# Patient Record
Sex: Male | Born: 1995 | Race: White | Hispanic: No | Marital: Single | Smoking: Current some day smoker
Health system: Southern US, Community
[De-identification: ages and names within clinical notes are randomized; demographics above are authoritative.]

## PROBLEM LIST (undated history)

## (undated) DIAGNOSIS — E669 Obesity, unspecified: Secondary | ICD-10-CM

## (undated) DIAGNOSIS — F319 Bipolar disorder, unspecified: Secondary | ICD-10-CM

## (undated) DIAGNOSIS — I10 Essential (primary) hypertension: Secondary | ICD-10-CM

## (undated) DIAGNOSIS — M109 Gout, unspecified: Secondary | ICD-10-CM

## (undated) DIAGNOSIS — F909 Attention-deficit hyperactivity disorder, unspecified type: Secondary | ICD-10-CM

## (undated) DIAGNOSIS — F329 Major depressive disorder, single episode, unspecified: Secondary | ICD-10-CM

## (undated) DIAGNOSIS — F32A Depression, unspecified: Secondary | ICD-10-CM

## (undated) DIAGNOSIS — E785 Hyperlipidemia, unspecified: Secondary | ICD-10-CM

## (undated) HISTORY — DX: Obesity, unspecified: E66.9

## (undated) HISTORY — DX: Major depressive disorder, single episode, unspecified: F32.9

## (undated) HISTORY — PX: WISDOM TOOTH EXTRACTION: SHX21

## (undated) HISTORY — DX: Hyperlipidemia, unspecified: E78.5

## (undated) HISTORY — DX: Depression, unspecified: F32.A

## (undated) HISTORY — DX: Attention-deficit hyperactivity disorder, unspecified type: F90.9

## (undated) HISTORY — DX: Bipolar disorder, unspecified: F31.9

---
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
## 2014-09-08 ENCOUNTER — Emergency Department: Payer: Self-pay | Admitting: Emergency Medicine

## 2015-06-18 DIAGNOSIS — Z8659 Personal history of other mental and behavioral disorders: Secondary | ICD-10-CM | POA: Insufficient documentation

## 2015-06-18 DIAGNOSIS — R51 Headache: Secondary | ICD-10-CM

## 2015-06-18 DIAGNOSIS — F329 Major depressive disorder, single episode, unspecified: Secondary | ICD-10-CM | POA: Insufficient documentation

## 2015-06-18 DIAGNOSIS — E669 Obesity, unspecified: Secondary | ICD-10-CM | POA: Insufficient documentation

## 2015-06-18 DIAGNOSIS — E782 Mixed hyperlipidemia: Secondary | ICD-10-CM | POA: Insufficient documentation

## 2015-06-18 DIAGNOSIS — F988 Other specified behavioral and emotional disorders with onset usually occurring in childhood and adolescence: Secondary | ICD-10-CM | POA: Insufficient documentation

## 2015-06-18 DIAGNOSIS — F32A Depression, unspecified: Secondary | ICD-10-CM | POA: Insufficient documentation

## 2015-06-18 DIAGNOSIS — R519 Headache, unspecified: Secondary | ICD-10-CM | POA: Insufficient documentation

## 2015-06-23 ENCOUNTER — Encounter: Payer: Self-pay | Admitting: Family Medicine

## 2015-06-23 ENCOUNTER — Ambulatory Visit (INDEPENDENT_AMBULATORY_CARE_PROVIDER_SITE_OTHER): Payer: Self-pay | Admitting: Family Medicine

## 2015-06-23 VITALS — BP 116/80 | HR 79 | Temp 98.3°F | Resp 16 | Ht 73.0 in | Wt 364.8 lb

## 2015-06-23 DIAGNOSIS — R079 Chest pain, unspecified: Secondary | ICD-10-CM

## 2015-06-23 DIAGNOSIS — Z23 Encounter for immunization: Secondary | ICD-10-CM

## 2015-06-23 NOTE — Patient Instructions (Addendum)
Keep Rolaids and take one next time you get chest tightness and see if it helps. Come in once a week for nurse bp checks for a couple of weeks.

## 2015-06-23 NOTE — Progress Notes (Signed)
Subjective:     Patient ID: Dean Tucker, male   DOB: 01/21/1996, 19 y.o.   MRN: [id number]  HPI  Chief Complaint  Patient presents with  . Annual Exam    Patient comes in office today for his annual exam he is accompanied by his mother. Patients mother has concerns of elevated blood pressure for the past 3-4 months, mother states that patient complains of frequent headaches. Patient would like to address chest pain that has been intermittent described as a dull ache on the right side of his chest , last episode was yesterday and lasted only a matter of minutes. Patient reported that when he had pain yesterdayt he laid down in bed.  Due to hx of intermittent chest tightness and elevated bp will focus on these issues rather than wellness visit today. + smoker but no family history of coronary artery disease. Elevated blood pressures were obtained with a home electronic cuff-? Cuff size too small.   Review of Systems  Respiratory: Negative for shortness of breath.   Cardiovascular: Negative for palpitations.  Gastrointestinal:       Reports rare heartburn:"when I eat spaghetti".       Objective:   Physical Exam  Constitutional: He appears well-developed and well-nourished. No distress.  Cardiovascular: Normal rate and regular rhythm.   Pulmonary/Chest: Breath sounds normal. He exhibits no tenderness.  Abdominal: Soft. There is no tenderness.       Assessment:    1. Chest pain, unspecified chest pain type - EKG 12-Lead - Lipid panel - Comprehensive metabolic panel  2. Need for influenza vaccination - Flu Vaccine QUAD 36+ mos IM    Plan:    Further f/u pending lab results.

## 2015-06-24 ENCOUNTER — Telehealth: Payer: Self-pay

## 2015-06-24 LAB — LIPID PANEL
CHOLESTEROL TOTAL: 235 mg/dL — AB (ref 100–169)
Chol/HDL Ratio: 7.6 ratio units — ABNORMAL HIGH (ref 0.0–5.0)
HDL: 31 mg/dL — ABNORMAL LOW (ref >39)
LDL CALC: 151 mg/dL — AB (ref 0–109)
Triglycerides: 267 mg/dL — ABNORMAL HIGH (ref 0–89)
VLDL Cholesterol Cal: 53 mg/dL — ABNORMAL HIGH (ref 5–40)

## 2015-06-24 LAB — COMPREHENSIVE METABOLIC PANEL
ALT: 39 IU/L (ref 0–44)
AST: 21 IU/L (ref 0–40)
Albumin/Globulin Ratio: 1.9 (ref 1.1–2.5)
Albumin: 4.7 g/dL (ref 3.5–5.5)
Alkaline Phosphatase: 67 IU/L (ref 39–117)
BUN/Creatinine Ratio: 11 (ref 8–19)
BUN: 9 mg/dL (ref 6–20)
Bilirubin Total: 0.5 mg/dL (ref 0.0–1.2)
CALCIUM: 10.2 mg/dL (ref 8.7–10.2)
CO2: 23 mmol/L (ref 18–29)
CREATININE: 0.82 mg/dL (ref 0.76–1.27)
Chloride: 102 mmol/L (ref 97–106)
GFR calc Af Amer: 148 mL/min/{1.73_m2} (ref >59)
GFR, EST NON AFRICAN AMERICAN: 128 mL/min/{1.73_m2} (ref >59)
GLOBULIN, TOTAL: 2.5 g/dL (ref 1.5–4.5)
GLUCOSE: 77 mg/dL (ref 65–99)
Potassium: 4.6 mmol/L (ref 3.5–5.2)
SODIUM: 143 mmol/L (ref 136–144)
Total Protein: 7.2 g/dL (ref 6.0–8.5)

## 2015-06-24 NOTE — Telephone Encounter (Signed)
-----   Message from Anola Gurneyobert Chauvin, [place] sent at 06/24/2015  7:57 AM EST ----- Cholesterol is moderately elevated but other labs including sugar are ok. I have provided a handout (at front desk) regarding elevated cholesterol and food choices. Would also refer to a nutritionist if you wish and your insurance covers.

## 2015-06-24 NOTE — Telephone Encounter (Signed)
Advised patient of lab report, at this time he has declined information handout and referral to nutritionist. I advised patient to watch for foods that are high in saturated fat and cholesterol and to try to become active at least 30 min a day. KW

## 2015-07-25 ENCOUNTER — Emergency Department (HOSPITAL_COMMUNITY): Admission: EM | Admit: 2015-07-25 | Discharge: 2015-07-25 | Discharge status: Absent without leave | Payer: Self-pay

## 2016-01-02 ENCOUNTER — Encounter: Payer: Self-pay | Admitting: Family Medicine

## 2016-01-02 ENCOUNTER — Ambulatory Visit (INDEPENDENT_AMBULATORY_CARE_PROVIDER_SITE_OTHER): Payer: Self-pay | Admitting: Family Medicine

## 2016-01-02 VITALS — BP 130/80 | HR 90 | Temp 97.8°F | Resp 16 | Wt 342.8 lb

## 2016-01-02 DIAGNOSIS — L0591 Pilonidal cyst without abscess: Secondary | ICD-10-CM

## 2016-01-02 NOTE — Progress Notes (Signed)
Subjective:     Patient ID: Dean Tucker, male   DOB: 03/25/1996, 20 y.o.   MRN: [id number]  HPI  Chief Complaint  Patient presents with  . Skin Problem    Patient comes in office today with concerns of a possible "bump/knot" on the right side of his buttock. Patient states that "bump" has not got bigger in size but is now draining bloody yellow pus.   States this has been a chronic problems for several months. No personal or family hx of MRSA.   Review of Systems     Objective:   Physical Exam  Constitutional: He appears well-developed and well-nourished. No distress.  Skin:  Buttock cleft with a pilonidal sinus with a small amount of purulent drainage. C & S obtained.       Assessment:    1. Infected pilonidal cyst - WOUND CULTURE    Plan:    Discussed bathtub soaks pending culture results. Encouraged seeking insurance coverage for a probable surgical referral.

## 2016-01-02 NOTE — Patient Instructions (Signed)
We will call you with the culture report. This will be a probable surgical remedy if you wish to get temporary insurance.

## 2016-01-04 LAB — WOUND CULTURE: ORGANISM ID, BACTERIA: NONE SEEN

## 2016-01-05 ENCOUNTER — Telehealth: Payer: Self-pay

## 2016-01-05 NOTE — Telephone Encounter (Signed)
Unable to leave message due to mailbox not being set up. Will try again later.

## 2016-01-05 NOTE — Telephone Encounter (Signed)
-----   Message from Anola Gurneyobert Chauvin, [place] sent at 01/05/2016  7:42 AM EDT ----- No specific organism detected. Do you wish to see a general surgeon at this time?

## 2016-01-12 ENCOUNTER — Ambulatory Visit
Admission: EM | Admit: 2016-01-12 | Discharge: 2016-01-12 | Disposition: A | Discharge status: Home / Self Care | Payer: Self-pay | Attending: Family Medicine | Admitting: Family Medicine

## 2016-01-12 DIAGNOSIS — R209 Unspecified disturbances of skin sensation: Secondary | ICD-10-CM

## 2016-01-12 DIAGNOSIS — M13871 Other specified arthritis, right ankle and foot: Secondary | ICD-10-CM

## 2016-01-12 DIAGNOSIS — M19071 Primary osteoarthritis, right ankle and foot: Secondary | ICD-10-CM

## 2016-01-12 MED ORDER — MELOXICAM 15 MG PO TABS: 15.0000 mg | ORAL_TABLET | Freq: Every day | ORAL | Status: DC | PRN

## 2016-01-12 NOTE — Discharge Instructions (Signed)
Take medication as prescribed. Rest. Elevate and apply ice.   Follow up with your primary care physician this week. Return to Urgent care or ER for redness, swelling, pain, sensation changes,  new or worsening concerns.

## 2016-01-12 NOTE — ED Provider Notes (Signed)
[facility]  ____________________________________________  Time seen: Approximately 6:24 PM  I have reviewed the triage vital signs and the nursing notes.   HISTORY  Chief Complaint Numbness   HPI Dean Tucker is a 20 y.o. male  presents with complaint of a numbness sensation to medial right ankle. Patient reports that this started yesterday evening. Patient denies known trauma or injury but reports that the sensation change started to occur during a long car trip. Patient states while he was sitting in a car which is approximately 5 hour one-way trip he did sit with his ankles turned outwards and had his heels close together. Patient reports that he only has one pair of shoes and states that he knows of these shoes are too tight for him and really worn. Patient reports that he had a mild sensation change on the way to his destination but states that that feeling has continued today. Patient reports on the way back he drove without changes sensation. States the sensation change is a "numbness like" but denies full numbness. States this sensation change is only present at medial right ankle, denies elsewhere. Denies any other sensation changes, numbness or tingling sensations. Denies any decreased strength or weakness. Denies any sensation changes to bottom of feet or to feet, calf or shin.   Denies any fall or trauma. Denies any pain or swelling. Denies any calf pain or swelling. Denies any redness or break in skin. Denies any history of similar. Reports not taking any medications for the same. Reports has not applied heat or ice. Denies any neck or back pain. Denies any urinary retention or incontinence. Denies back pain radiation. State moved around frequently in the car but then would rest his right foot in that same position.   Denies any chest pain, shortness of breath, abdominal pain, dysuria, neck pain, back pain, pain radiation, tingling or sensation change or radiation,  weakness, dizziness, headaches or vision changes. Denies any recent sickness, other immobilization, fevers, recent surgeries, chest pain, shortness of breath or other complaints.  Denies any recent insect bites, tick bites or tick exposures.  PCP: [facility].  Past Medical History  Diagnosis Date  . Hyperlipidemia   . Obesity   . ADHD (attention deficit hyperactivity disorder)   . Bipolar disorder (HCC)   . Depression     Patient Active Problem List   Diagnosis Date Noted  . ADD (attention deficit disorder) 06/18/2015  . H/O manic depressive disorder 06/18/2015  . Cephalalgia 06/18/2015  . H/O elevated lipids 06/18/2015  . Obesity 06/18/2015    Past Surgical History  Procedure Laterality Date  . Adenoidectomy  10/1996  . Wisdom tooth extraction      Current Outpatient Rx                         Allergies Review of patient's allergies indicates no known allergies.  Family History  Problem Relation Age of Onset  . Healthy Mother   . Healthy Father   . Healthy Sister   . Healthy Brother   . Healthy Daughter   . Hypertension Maternal Grandmother   . Hypertension Maternal Grandfather   . Diabetes Maternal Grandfather     pre-diabetic  . Healthy Brother   . Healthy Brother   . Healthy Brother   . Healthy Brother     Social History Social History  Substance Use Topics  . Smoking status: Current Some Day Smoker    Types:  Cigarettes  . Smokeless tobacco: Never Used  . Alcohol Use: 0.0 oz/week    0 Standard drinks or equivalent per week     Comment: occasional     Review of Systems Constitutional: No fever/chills Eyes: No visual changes. ENT: No sore throat. Cardiovascular: Denies chest pain. Respiratory: Denies shortness of breath. Gastrointestinal: No abdominal pain.  No nausea, no vomiting.  No diarrhea.  No constipation. Genitourinary: Negative for dysuria. Musculoskeletal: Negative for back pain.As above. Skin: Negative for  rash. Neurological: Negative for headaches, focal weakness or numbness.  10-point ROS otherwise negative.  ____________________________________________   PHYSICAL EXAM:  VITAL SIGNS: ED Triage Vitals  Enc Vitals Group     BP 01/12/16 1620 144/89 mmHg     Pulse Rate 01/12/16 1620 84     Resp 01/12/16 1620 18     Temp 01/12/16 1620 97.9 F (36.6 C)     Temp Source 01/12/16 1620 Oral     SpO2 01/12/16 1620 100 %     Weight 01/12/16 1620 340 lb (154.223 kg)     Height 01/12/16 1620 6\' 2"  (1.88 m)     Head Cir --      Peak Flow --      Pain Score 01/12/16 1622 0     Pain Loc --      Pain Edu? --      Excl. in GC? --     Constitutional: Alert and oriented. Well appearing and in no acute distress. Eyes: Conjunctivae are normal. PERRL. EOMI. Head: Atraumatic.  Ears: normal external appearance bilaterally.   Nose: No congestion/rhinnorhea.  Mouth/Throat: Mucous membranes are moist.   Neck: No stridor.  No cervical spine tenderness to palpation. Cardiovascular: Normal rate, regular rhythm. Grossly normal heart sounds.  Good peripheral circulation. Respiratory: Normal respiratory effort.  No retractions. Lungs CTAB. No wheezes, rales or rhonchi.  Gastrointestinal: Soft and nontender. Obese abdomen.  Musculoskeletal: No lower or upper extremity tenderness nor edema.  No cervical, thoracic or lumbar tenderness. Bilateral pedal pulses equal and easily palpated.  Except: Right medial ankle subjective decreased sensation, able to still distinguish between sharp and dull, nontender, no swelling, no erythema, skin intact; right lower extremity nontender, no swelling, skin intact. No swelling, redness, ecchymosis noted to right lower extremity; full range of motion right lower extremity with equal bilateral strength; no calf pain or swelling, no right lower extremity pain or swelling. No calf tenderness or posterior leg tenderness bilaterally. Bilateral pedal pulses equal and easily palpated.  Ambulatory with steady gait. Changes position from lying to standing quickly without any discomfort, or distress noted or unsteady gait. Negative Homan's sign bilaterally.  Neurologic:  Normal speech and language. No gross focal neurologic deficits are appreciated. No gait instability.5/5 strength to bilaterally upper and lower extremities. Steady gait.  Skin:  Skin is warm, dry and intact. No rash noted. Psychiatric: Mood and affect are normal. Speech and behavior are normal.  ____________________________________________   LABS (all labs ordered are listed, but only abnormal results are displayed)  Labs Reviewed - No data to display ____________________________________________  RADIOLOGY  No results found. ____________________________________________  INITIAL IMPRESSION / ASSESSMENT AND PLAN / ED COURSE  Pertinent labs & imaging results that were available during my care of the patient were reviewed by me and considered in my medical decision making (see chart for details).Discussed patient and planning care with Dr. Judd Gaudier who agrees with plan.  Well-appearing patient. No acute distress. Patient is an obese patient who just recently  went on a long car trip and during car trip rested right foot with shoe against medial ankle. Patient reports while in this long car trip he was resting with his heels somewhat together and his ankles rolled outwards with his shoe pressing against the medial right ankle. Patient denies any pain, swelling or redness. Patient reports he is here as there is a somewhat numbness sensation change to his medial ankle. During exam patient also sitting similar position with right ankle rolled outwards. Patient shoe appears to be tight in the same location that patient is stating of sensation changes. Patient reports slightly diminished sensation to right medial ankle that able to still distinguish between sharp and dull. No swelling, redness, ecchymosis noted to right  lower extremity; full range of motion right lower extremity with equal bilateral strength; no calf pain or swelling, no right lower extremity pain or swelling. Denies need for crutches or splinting. Patient has remained active without recent surgery or immobilization other than as mentioned. Patient reports he did still continue to move legs frequently during car trip. Has sensation change localized only to medial right ankle with recent history of position as stated above. Discuss with patient elevation, ice, avoidance of irritation, change shoes and daily Mobic.   Suspect local inflammatory response due to position above causing sensation changes.   Discussed strict follow-up and return parameters including the redness comes, continued complaints or other sensation changes. Patient agreed to this plan and states that he would rather monitor at home at home and will have close follow-up. Discussed  follow up with Primary care at [facility]  family practice this week. Discussed follow up and return parameters including no resolution, redness, pain, swelling, or any worsening concerns. Patient verbalized understanding and agreed to plan.   ____________________________________________   FINAL CLINICAL IMPRESSION(S) / ED DIAGNOSES  Final diagnoses:  Ankle inflammation, left     Discharge Medication List as of 01/12/2016  5:47 PM    START taking these medications   Details  meloxicam (MOBIC) 15 MG tablet Take 1 tablet (15 mg total) by mouth daily as needed for pain., Starting 01/12/2016, Until Discontinued, Normal        Note: This dictation was prepared with Dragon dictation along with smaller phrase technology. Any transcriptional errors that result from this process are unintentional.       Renford Dills, NP 01/12/16 1923

## 2016-01-12 NOTE — ED Notes (Signed)
Patients complains of right leg numbness that started last night around 10:30pm. He states he drove for the majority of the day yesterday, but today  it feels like it is getting number throughout the day.

## 2016-01-14 NOTE — Telephone Encounter (Signed)
Patinet advised and states he wishes to wait on going to a surgeon until his finances are a little better.  ED

## 2016-03-27 ENCOUNTER — Encounter: Payer: Self-pay | Admitting: Gynecology

## 2016-03-27 ENCOUNTER — Ambulatory Visit
Admission: EM | Admit: 2016-03-27 | Discharge: 2016-03-27 | Disposition: A | Discharge status: Home / Self Care | Payer: Self-pay | Attending: Family Medicine | Admitting: Family Medicine

## 2016-03-27 DIAGNOSIS — G43009 Migraine without aura, not intractable, without status migrainosus: Secondary | ICD-10-CM

## 2016-03-27 MED ORDER — HYDROCODONE-ACETAMINOPHEN 5-325 MG PO TABS: ORAL_TABLET | ORAL | 0 refills | Status: DC

## 2016-03-27 NOTE — ED Provider Notes (Signed)
[facility]    CSN: [id number] Arrival date & time: 03/27/16  [id number]  First Provider Contact:  None       History   Chief Complaint Chief Complaint  Patient presents with  . Migraine    HPI Brynda PeonVictor M Sharpnack is a 20 y.o. male.   The history is provided by the patient.  Migraine  This is a new problem. The current episode started yesterday. The problem occurs constantly. The problem has not changed since onset.Associated symptoms include headaches. Pertinent negatives include no chest pain, no abdominal pain and no shortness of breath. Nothing relieves the symptoms. He has tried ASA for the symptoms.  Per patient c/o migraine. Patient stated x last pm. headache and sensitivity to light and noise. Denies fever, chills, numbness/tingling, one sided weakness, or "worst headache ever".  Per patient need work note for today.  Past Medical History:  Diagnosis Date  . ADHD (attention deficit hyperactivity disorder)   . Bipolar disorder (HCC)   . Depression   . Hyperlipidemia   . Obesity     Patient Active Problem List   Diagnosis Date Noted  . ADD (attention deficit disorder) 06/18/2015  . H/O manic depressive disorder 06/18/2015  . Cephalalgia 06/18/2015  . H/O elevated lipids 06/18/2015  . Obesity 06/18/2015    Past Surgical History:  Procedure Laterality Date  . ADENOIDECTOMY  10/1996  . WISDOM TOOTH EXTRACTION         Home Medications    Prior to Admission medications   Medication Sig Start Date End Date Taking? Authorizing Provider  HYDROcodone-acetaminophen (NORCO/VICODIN) 5-325 MG tablet 1-2 tabs po q 8 hours prn 03/27/16   Payton Mccallumrlando Supriya Beaston, MD  meloxicam (MOBIC) 15 MG tablet Take 1 tablet (15 mg total) by mouth daily as needed for pain. 01/12/16   Renford DillsLindsey Miller, NP    Family History Family History  Problem Relation Age of Onset  . Healthy Mother   . Healthy Sister   . Healthy Brother   . Healthy Daughter   . Hypertension Maternal Grandmother   .  Hypertension Maternal Grandfather   . Diabetes Maternal Grandfather     pre-diabetic  . Healthy Brother   . Healthy Brother   . Healthy Brother   . Healthy Brother   . Healthy Father     Social History Social History  Substance Use Topics  . Smoking status: Current Some Day Smoker    Types: Cigarettes  . Smokeless tobacco: Never Used  . Alcohol use 0.0 oz/week     Comment: occasional      Allergies   Review of patient's allergies indicates no known allergies.   Review of Systems Review of Systems  Respiratory: Negative for shortness of breath.   Cardiovascular: Negative for chest pain.  Gastrointestinal: Negative for abdominal pain.  Neurological: Positive for headaches.     Physical Exam Triage Vital Signs ED Triage Vitals  Enc Vitals Group     BP 03/27/16 0902 (!) 136/96     Pulse Rate 03/27/16 0902 75     Resp 03/27/16 0902 18     Temp 03/27/16 0902 97.9 F (36.6 C)     Temp Source 03/27/16 0902 Oral     SpO2 03/27/16 0902 100 %     Weight 03/27/16 0904 253 lb (114.8 kg)     Height 03/27/16 0904 6\' 3"  (1.905 m)     Head Circumference --      Peak Flow --  Pain Score 03/27/16 0907 5     Pain Loc --      Pain Edu? --      Excl. in GC? --    No data found.   Updated Vital Signs BP (!) 136/96 (BP Location: Left Arm)   Pulse 75   Temp 97.9 F (36.6 C) (Oral)   Resp 18   Ht 6\' 3"  (1.905 m)   Wt 253 lb (114.8 kg)   SpO2 100%   BMI 31.62 kg/m   Visual Acuity Right Eye Distance:   Left Eye Distance:   Bilateral Distance:    Right Eye Near:   Left Eye Near:    Bilateral Near:     Physical Exam  Constitutional: He is oriented to person, place, and time. He appears well-developed and well-nourished. No distress.  HENT:  Head: Normocephalic and atraumatic.  Right Ear: Tympanic membrane, external ear and ear canal normal.  Left Ear: Tympanic membrane, external ear and ear canal normal.  Nose: Nose normal.  Mouth/Throat: Uvula is midline,  oropharynx is clear and moist and mucous membranes are normal. No oropharyngeal exudate or tonsillar abscesses.  Eyes: Conjunctivae and EOM are normal. Pupils are equal, round, and reactive to light. Right eye exhibits no discharge. Left eye exhibits no discharge. No scleral icterus.  Neck: Normal range of motion. Neck supple. No tracheal deviation present. No thyromegaly present.  Cardiovascular: Normal rate, regular rhythm and normal heart sounds.   Pulmonary/Chest: Effort normal and breath sounds normal. No stridor. No respiratory distress. He has no wheezes. He has no rales. He exhibits no tenderness.  Lymphadenopathy:    He has no cervical adenopathy.  Neurological: He is alert and oriented to person, place, and time. He has normal reflexes. He displays normal reflexes. No cranial nerve deficit. He exhibits normal muscle tone. Coordination normal.  Skin: Skin is warm and dry. No rash noted. He is not diaphoretic.  Psychiatric: He has a normal mood and affect. His behavior is normal. Thought content normal.  Nursing note and vitals reviewed.    UC Treatments / Results  Labs (all labs ordered are listed, but only abnormal results are displayed) Labs Reviewed - No data to display  EKG  EKG Interpretation None       Radiology No results found.  Procedures Procedures (including critical care time)  Medications Ordered in UC Medications - No data to display   Initial Impression / Assessment and Plan / UC Course  I have reviewed the triage vital signs and the nursing notes.  Pertinent labs & imaging results that were available during my care of the patient were reviewed by me and considered in my medical decision making (see chart for details).  Clinical Course      Final Clinical Impressions(s) / UC Diagnoses   Final diagnoses:  Nonintractable migraine, unspecified migraine type    New Prescriptions New Prescriptions   HYDROCODONE-ACETAMINOPHEN (NORCO/VICODIN)  5-325 MG TABLET    1-2 tabs po q 8 hours prn     Payton Mccallum, MD 03/27/16 [id number]

## 2016-03-27 NOTE — ED Triage Notes (Signed)
Per patient c/o migraine. Patient stated x last pm. headache and sensitivity to light and noise. Per patient need work note for today.

## 2016-03-27 NOTE — Discharge Instructions (Signed)
Follow up with Primary Care Provider °

## 2016-07-31 ENCOUNTER — Ambulatory Visit (INDEPENDENT_AMBULATORY_CARE_PROVIDER_SITE_OTHER): Payer: Self-pay | Admitting: Family Medicine

## 2016-07-31 ENCOUNTER — Encounter: Payer: Self-pay | Admitting: Family Medicine

## 2016-07-31 VITALS — BP 138/94 | HR 84 | Temp 98.7°F | Resp 16 | Wt 358.0 lb

## 2016-07-31 DIAGNOSIS — R112 Nausea with vomiting, unspecified: Secondary | ICD-10-CM

## 2016-07-31 DIAGNOSIS — R1084 Generalized abdominal pain: Secondary | ICD-10-CM

## 2016-07-31 DIAGNOSIS — F329 Major depressive disorder, single episode, unspecified: Secondary | ICD-10-CM

## 2016-07-31 DIAGNOSIS — R519 Headache, unspecified: Secondary | ICD-10-CM

## 2016-07-31 DIAGNOSIS — F32A Depression, unspecified: Secondary | ICD-10-CM

## 2016-07-31 DIAGNOSIS — R51 Headache: Secondary | ICD-10-CM

## 2016-07-31 MED ORDER — SERTRALINE HCL 50 MG PO TABS: 50.0000 mg | ORAL_TABLET | Freq: Every day | ORAL | 1 refills | Status: DC

## 2016-07-31 NOTE — Progress Notes (Signed)
Patient: Dean Tucker Male    DOB: 03-04-1996   21 y.o.   MRN: [id number] Visit Date: 07/31/2016  Today's Provider: Mila Merry, MD   Chief Complaint  Patient presents with  . Headache  . Diarrhea    Has been going on for several months.    Subjective:    Headache   This is a new problem. The current episode started more than 1 month ago. The pain is located in the bilateral and frontal region. The pain radiates to the left neck and right neck. The pain quality is not similar to prior headaches. The quality of the pain is described as aching and dull. The pain is at a severity of 6/10. Associated symptoms include abdominal pain (Cramping pain), nausea, neck pain and photophobia. Pertinent negatives include no dizziness, ear pain, fever, loss of balance, phonophobia, scalp tenderness, seizures, sinus pressure or sore throat. Vomiting: Started today.  Diarrhea   This is a chronic problem. The current episode started more than 1 month ago. The problem occurs 2 to 4 times per day. The problem has been unchanged. The patient states that diarrhea awakens (Especially last night) him from sleep. Associated symptoms include abdominal pain (Cramping pain), bloating and headaches. Pertinent negatives include no chills, fever or sweats. Vomiting: Started today. The symptoms are aggravated by stress. He has tried nothing for the symptoms.   He states he has had problems with nearly daily headache for several weeks. Has also been getting nauseated in the morning most days for several months, but they two do not always coincide. States it is not unusual for him to vomit once or twice in the morning before nausea subsides. States was worse than usual this morning and had to leave work. He states he has been meaning to come in for this for awhile, but has just got on new health insurance plan this week. Denies any fever or chills.  He also has history of mood disorder, per old records was on  sertraline for depression. Patient thinks maybe he was diagnosed with bipolar. He had been on sertraline last prescribed in 2015 which has been out of for the last year. He states he was been moody and sometimes depressed lately and wants to get started back on medication.     No Known Allergies   Current Outpatient Prescriptions:  .  HYDROcodone-acetaminophen (NORCO/VICODIN) 5-325 MG tablet, 1-2 tabs po q 8 hours prn (Patient not taking: Reported on 07/31/2016), Disp: 6 tablet, Rfl: 0 .  meloxicam (MOBIC) 15 MG tablet, Take 1 tablet (15 mg total) by mouth daily as needed for pain. (Patient not taking: Reported on 07/31/2016), Disp: 10 tablet, Rfl: 0  Review of Systems  Constitutional: Positive for diaphoresis. Negative for activity change, appetite change, chills, fatigue, fever and unexpected weight change.  HENT: Negative for congestion, ear discharge, ear pain, postnasal drip, sinus pain, sinus pressure and sore throat.   Eyes: Positive for photophobia.  Gastrointestinal: Positive for abdominal distention, abdominal pain (Cramping pain), bloating, diarrhea and nausea. Negative for anal bleeding, blood in stool, constipation and rectal pain. Vomiting: Started today.  Musculoskeletal: Positive for neck pain.  Neurological: Positive for speech difficulty (Right ankle has been numb at some points), light-headedness and headaches. Negative for dizziness, tremors, seizures, syncope and loss of balance.    Social History  Substance Use Topics  . Smoking status: Current Some Day Smoker    Types: Cigarettes  . Smokeless tobacco: Never Used  .  Alcohol use 0.0 oz/week     Comment: occasional    Objective:   BP (!) 138/94 (BP Location: Left Arm, Patient Position: Sitting, Cuff Size: Large)   Pulse 84   Temp 98.7 F (37.1 C) (Oral)   Resp 16   Wt (!) 358 lb (162.4 kg)   BMI 44.75 kg/m   Physical Exam  General Appearance:    Alert, cooperative, no distress, morbidly obese. Appears well iand  shows no discomfort at all.   Eyes:    PERRL, conjunctiva/corneas clear, EOM's intact       Lungs:     Clear to auscultation bilaterally, respirations unlabored  Heart:    Regular rate and rhythm  Abdomen:   bowel sounds present and normal in all 4 quadrants, soft, round, nontender or nondistended. No CVA tenderness        Assessment & Plan:     1. Nonintractable headache, unspecified chronicity pattern, unspecified headache type No indications of intracranial lesions. Suspect this is related to stress or chronic nausea. Labs as below.   2. Depression, unspecified depression type Was previously treated with 100mg  sertraline every day. Will restart 1/2 of 50mg  a day for six, then increase to 50mg  daily and follow up with Toni ArthursBob Chauvin within a month.   3. Nausea and vomiting, intractability of vomiting not specified, unspecified vomiting type His primary reason for coming in this morning is for work excuse for vomiting today, however these symptoms have been occurring intermittenly for a few months. Will start workup, Consider abdominal imaging.  - CBC - Comprehensive metabolic panel - TSH - Amylase - Lipase  4. Generalized abdominal pain  - CBC - Comprehensive metabolic panel - TSH - Amylase - Lipase     The entirety of the information documented in the History of Present Illness, Review of Systems and Physical Exam were personally obtained by me. Portions of this information were initially documented by Kavin LeechLaura Walsh, CMA and reviewed by me for thoroughness and accuracy.    Mila Merryonald Rosamond Andress, MD  [facility]

## 2016-08-02 ENCOUNTER — Encounter: Payer: Self-pay | Admitting: Family Medicine

## 2016-11-03 ENCOUNTER — Ambulatory Visit (INDEPENDENT_AMBULATORY_CARE_PROVIDER_SITE_OTHER): Payer: BLUE CROSS/BLUE SHIELD | Admitting: Family Medicine

## 2016-11-03 ENCOUNTER — Encounter: Payer: Self-pay | Admitting: Family Medicine

## 2016-11-03 ENCOUNTER — Other Ambulatory Visit: Payer: Self-pay | Admitting: Family Medicine

## 2016-11-03 VITALS — BP 122/86 | HR 91 | Temp 98.2°F | Resp 17 | Wt 357.8 lb

## 2016-11-03 DIAGNOSIS — R1031 Right lower quadrant pain: Secondary | ICD-10-CM

## 2016-11-03 DIAGNOSIS — M79671 Pain in right foot: Secondary | ICD-10-CM

## 2016-11-03 LAB — POCT URINALYSIS DIPSTICK
Bilirubin, UA: NEGATIVE
Blood, UA: NEGATIVE
Glucose, UA: NEGATIVE
KETONES UA: NEGATIVE
Leukocytes, UA: NEGATIVE — AB
Nitrite, UA: NEGATIVE
PROTEIN UA: NEGATIVE
Spec Grav, UA: 1.01 (ref 1.010–1.025)
Urobilinogen, UA: 0.2 E.U./dL
pH, UA: 5 (ref 5.0–8.0)

## 2016-11-03 MED ORDER — MELOXICAM 15 MG PO TABS: 15.0000 mg | ORAL_TABLET | Freq: Every day | ORAL | 0 refills | Status: DC

## 2016-11-03 NOTE — Patient Instructions (Addendum)
Get new supportive work shoes. Try the meloxicam for your abdominal pain and foot pain. If abdominal pain worsens or accompanied by fever or chills call me or report to the ER.

## 2016-11-03 NOTE — Progress Notes (Signed)
Subjective:     Patient ID: Dean Tucker, male   DOB: 04-Apr-1996, 21 y.o.   MRN: [id number]  HPI  Chief Complaint  Patient presents with  . Abdominal Pain    Patient comes into office today with complaints of RLQ pain for the past 2-3 days. Paitent states that pain had increased yesterday, patient describes pain as sharp. Associated with pain patient reports constipation, gas, bloating and nuasea/vomiting.   . Foot Pain    Patient reports pain on the right side of his foot for the past 2-51months intermittent, patient describes pain as sharp. Patient reports history of ingrown toe nail and states that he believed pain started when he started wearing his work shoes more often.   States he moved his bowels a lot last night after feeling constipated for four days. States sharp abdominal pain bothers him both when he has been standing up or bending over. Reports his work shoes are "torn up" and his lateral right foot pain is intermittent with w.b. Continues to work at [employer].   Review of Systems     Objective:   Physical Exam  Constitutional: He appears well-developed and well-nourished. No distress.  Cardiovascular:  Pulses:      Dorsalis pedis pulses are 2+ on the right side.       Posterior tibial pulses are 2+ on the right side.  Abdominal: Soft. There is tenderness (right lower quadrant). There is guarding.  Musculoskeletal:  Right foot without tenderness or deformity. Ankle ligaments mildly lax to testing. DF/PF 5/5       Assessment:    1. Right lower quadrant abdominal pain: will treat as abdominal wall strain for now - POCT urinalysis dipstick - meloxicam (MOBIC) 15 MG tablet; Take 1 tablet (15 mg total) by mouth daily.  Dispense: 30 tablet; Refill: 0  2. Right foot pain: patient defers x-rays    Plan:    Get new supportive work shoes. Further f/u if abd pain or foot pain not improving. He is to report to the ER for increased abdominal pain, fever or chills. Consider CT  scan.

## 2016-11-05 ENCOUNTER — Emergency Department: Payer: BLUE CROSS/BLUE SHIELD

## 2016-11-05 ENCOUNTER — Emergency Department
Admission: EM | Admit: 2016-11-05 | Discharge: 2016-11-05 | Disposition: A | Discharge status: Home / Self Care | Payer: BLUE CROSS/BLUE SHIELD | Attending: Student in an Organized Health Care Education/Training Program | Admitting: Student in an Organized Health Care Education/Training Program

## 2016-11-05 ENCOUNTER — Encounter: Payer: Self-pay | Admitting: Emergency Medicine

## 2016-11-05 DIAGNOSIS — F17210 Nicotine dependence, cigarettes, uncomplicated: Secondary | ICD-10-CM | POA: Diagnosis not present

## 2016-11-05 DIAGNOSIS — F909 Attention-deficit hyperactivity disorder, unspecified type: Secondary | ICD-10-CM | POA: Diagnosis not present

## 2016-11-05 DIAGNOSIS — R1031 Right lower quadrant pain: Secondary | ICD-10-CM | POA: Insufficient documentation

## 2016-11-05 DIAGNOSIS — Z79899 Other long term (current) drug therapy: Secondary | ICD-10-CM | POA: Diagnosis not present

## 2016-11-05 LAB — CBC
HCT: 45.2 % (ref 40.0–52.0)
HEMOGLOBIN: 15.5 g/dL (ref 13.0–18.0)
MCH: 30 pg (ref 26.0–34.0)
MCHC: 34.3 g/dL (ref 32.0–36.0)
MCV: 87.4 fL (ref 80.0–100.0)
PLATELETS: 296 10*3/uL (ref 150–440)
RBC: 5.17 MIL/uL (ref 4.40–5.90)
RDW: 13.3 % (ref 11.5–14.5)
WBC: 9.1 10*3/uL (ref 3.8–10.6)

## 2016-11-05 LAB — COMPREHENSIVE METABOLIC PANEL
ALBUMIN: 4.7 g/dL (ref 3.5–5.0)
ALK PHOS: 55 U/L (ref 38–126)
ALT: 44 U/L (ref 17–63)
AST: 26 U/L (ref 15–41)
Anion gap: 5 (ref 5–15)
BUN: 12 mg/dL (ref 6–20)
CALCIUM: 9.7 mg/dL (ref 8.9–10.3)
CO2: 26 mmol/L (ref 22–32)
CREATININE: 0.67 mg/dL (ref 0.61–1.24)
Chloride: 107 mmol/L (ref 101–111)
GFR calc Af Amer: >60 mL/min (ref >60)
GFR calc non Af Amer: >60 mL/min (ref >60)
GLUCOSE: 86 mg/dL (ref 65–99)
Potassium: 3.9 mmol/L (ref 3.5–5.1)
SODIUM: 138 mmol/L (ref 135–145)
Total Bilirubin: 0.8 mg/dL (ref 0.3–1.2)
Total Protein: 8.3 g/dL — ABNORMAL HIGH (ref 6.5–8.1)

## 2016-11-05 LAB — URINALYSIS, COMPLETE (UACMP) WITH MICROSCOPIC
Bacteria, UA: NONE SEEN
Bilirubin Urine: NEGATIVE
Glucose, UA: NEGATIVE mg/dL
HGB URINE DIPSTICK: NEGATIVE
Ketones, ur: NEGATIVE mg/dL
LEUKOCYTES UA: NEGATIVE
Nitrite: NEGATIVE
Protein, ur: NEGATIVE mg/dL
RBC / HPF: NONE SEEN RBC/hpf (ref 0–5)
SPECIFIC GRAVITY, URINE: 1.018 (ref 1.005–1.030)
pH: 7 (ref 5.0–8.0)

## 2016-11-05 LAB — LIPASE, BLOOD: Lipase: 15 U/L (ref 11–51)

## 2016-11-05 MED ORDER — NAPROXEN 500 MG PO TABS: 500.0000 mg | ORAL_TABLET | Freq: Two times a day (BID) | ORAL | 0 refills | Status: DC

## 2016-11-05 MED ORDER — ONDANSETRON HCL 4 MG/2ML IJ SOLN
4.0000 mg | Freq: Once | INTRAMUSCULAR | Status: AC
  Administered 2016-11-05: 4 mg via INTRAVENOUS
  Filled 2016-11-05: qty 2

## 2016-11-05 MED ORDER — IOPAMIDOL (ISOVUE-300) INJECTION 61%
125.0000 mL | Freq: Once | INTRAVENOUS | Status: AC | PRN
  Administered 2016-11-05: 125 mL via INTRAVENOUS

## 2016-11-05 MED ORDER — SODIUM CHLORIDE 0.9 % IV BOLUS (SEPSIS)
1000.0000 mL | Freq: Once | INTRAVENOUS | Status: AC
  Administered 2016-11-05: 1000 mL via INTRAVENOUS

## 2016-11-05 MED ORDER — MORPHINE SULFATE (PF) 4 MG/ML IV SOLN
4.0000 mg | INTRAVENOUS | Status: DC | PRN
  Administered 2016-11-05: 4 mg via INTRAVENOUS
  Filled 2016-11-05: qty 1

## 2016-11-05 NOTE — ED Provider Notes (Signed)
[facility] Emergency Department Provider Note    None    (approximate)  I have reviewed the triage vital signs and the nursing notes.   HISTORY  Chief Complaint Abdominal Pain    HPI Dean Tucker is a 21 y.o. male presents with 1 week of worsening right lower quadrant pain. Patient was seen by PCP early in the week. Thought to be secondary to constipation. Since then the pain is not improved. No measured fevers. Denies any shortness of breath.   Past Medical History:  Diagnosis Date  . ADHD (attention deficit hyperactivity disorder)   . Bipolar disorder (HCC)   . Depression   . Hyperlipidemia   . Obesity    Family History  Problem Relation Age of Onset  . Healthy Mother   . Healthy Sister   . Healthy Brother   . Healthy Daughter   . Hypertension Maternal Grandmother   . Hypertension Maternal Grandfather   . Diabetes Maternal Grandfather     pre-diabetic  . Healthy Brother   . Healthy Brother   . Healthy Brother   . Healthy Brother   . Healthy Father    Past Surgical History:  Procedure Laterality Date  . ADENOIDECTOMY  10/1996  . WISDOM TOOTH EXTRACTION     Patient Active Problem List   Diagnosis Date Noted  . ADD (attention deficit disorder) 06/18/2015  . H/O manic depressive disorder 06/18/2015  . H/O elevated lipids 06/18/2015  . Obesity 06/18/2015      Prior to Admission medications   Medication Sig Start Date End Date Taking? Authorizing Provider  meloxicam (MOBIC) 15 MG tablet Take 1 tablet (15 mg total) by mouth daily. 11/03/16   Anola Gurney, PA  sertraline (ZOLOFT) 50 MG tablet Take 1 tablet (50 mg total) by mouth daily. 1/2 tablet every morning for 6 days, then increase to one tablet daily Patient not taking: Reported on 11/03/2016 07/31/16   Malva Limes, MD    Allergies Patient has no known allergies.    Social History Social History  Substance Use Topics  . Smoking status: Current Some Day Smoker      Types: Cigarettes  . Smokeless tobacco: Never Used  . Alcohol use 0.0 oz/week     Comment: occasional     Review of Systems Patient denies headaches, rhinorrhea, blurry vision, numbness, shortness of breath, chest pain, edema, cough, abdominal pain, nausea, vomiting, diarrhea, dysuria, fevers, rashes or hallucinations unless otherwise stated above in HPI. ____________________________________________   PHYSICAL EXAM:  VITAL SIGNS: Vitals:   11/05/16 1351  BP: (!) 158/98  Pulse: 74  Resp: 16  Temp: 98.5 F (36.9 C)    Constitutional: Alert and oriented. Well appearing and in no acute distress. Eyes: Conjunctivae are normal. PERRL. EOMI. Head: Atraumatic. Nose: No congestion/rhinnorhea. Mouth/Throat: Mucous membranes are moist.  Oropharynx non-erythematous. Neck: No stridor. Painless ROM. No cervical spine tenderness to palpation Hematological/Lymphatic/Immunilogical: No cervical lymphadenopathy. Cardiovascular: Normal rate, regular rhythm. Grossly normal heart sounds.  Good peripheral circulation. Respiratory: Normal respiratory effort.  No retractions. Lungs CTAB. Gastrointestinal: Soft with mild ttp of RLQ. No distention. No abdominal bruits. No CVA tenderness. Genitourinary:  Musculoskeletal: No lower extremity tenderness nor edema.  No joint effusions. Neurologic:  Normal speech and language. No gross focal neurologic deficits are appreciated. No gait instability. Skin:  Skin is warm, dry and intact. No rash noted. Psychiatric: Mood and affect are normal. Speech and behavior are normal.  ____________________________________________   LABS (all labs  ordered are listed, but only abnormal results are displayed)  Results for orders placed or performed during the hospital encounter of 11/05/16 (from the past 24 hour(s))  Lipase, blood     Status: None   Collection Time: 11/05/16  1:49 PM  Result Value Ref Range   Lipase 15 11 - 51 U/L  Comprehensive metabolic panel      Status: Abnormal   Collection Time: 11/05/16  1:49 PM  Result Value Ref Range   Sodium 138 135 - 145 mmol/L   Potassium 3.9 3.5 - 5.1 mmol/L   Chloride 107 101 - 111 mmol/L   CO2 26 22 - 32 mmol/L   Glucose, Bld 86 65 - 99 mg/dL   BUN 12 6 - 20 mg/dL   Creatinine, Ser 8.29 0.61 - 1.24 mg/dL   Calcium 9.7 8.9 - 56.2 mg/dL   Total Protein 8.3 (H) 6.5 - 8.1 g/dL   Albumin 4.7 3.5 - 5.0 g/dL   AST 26 15 - 41 U/L   ALT 44 17 - 63 U/L   Alkaline Phosphatase 55 38 - 126 U/L   Total Bilirubin 0.8 0.3 - 1.2 mg/dL   GFR calc non Af Amer >60 >60 mL/min   GFR calc Af Amer >60 >60 mL/min   Anion gap 5 5 - 15  CBC     Status: None   Collection Time: 11/05/16  1:49 PM  Result Value Ref Range   WBC 9.1 3.8 - 10.6 K/uL   RBC 5.17 4.40 - 5.90 MIL/uL   Hemoglobin 15.5 13.0 - 18.0 g/dL   HCT 13.0 86.5 - 78.4 %   MCV 87.4 80.0 - 100.0 fL   MCH 30.0 26.0 - 34.0 pg   MCHC 34.3 32.0 - 36.0 g/dL   RDW 69.6 29.5 - 28.4 %   Platelets 296 150 - 440 K/uL  Urinalysis, Complete w Microscopic     Status: Abnormal   Collection Time: 11/05/16  1:52 PM  Result Value Ref Range   Color, Urine YELLOW (A) YELLOW   APPearance CLEAR (A) CLEAR   Specific Gravity, Urine 1.018 1.005 - 1.030   pH 7.0 5.0 - 8.0   Glucose, UA NEGATIVE NEGATIVE mg/dL   Hgb urine dipstick NEGATIVE NEGATIVE   Bilirubin Urine NEGATIVE NEGATIVE   Ketones, ur NEGATIVE NEGATIVE mg/dL   Protein, ur NEGATIVE NEGATIVE mg/dL   Nitrite NEGATIVE NEGATIVE   Leukocytes, UA NEGATIVE NEGATIVE   RBC / HPF NONE SEEN 0 - 5 RBC/hpf   WBC, UA 0-5 0 - 5 WBC/hpf   Bacteria, UA NONE SEEN NONE SEEN   Squamous Epithelial / LPF 0-5 (A) NONE SEEN   Mucous PRESENT    ____________________________________________ ____________________________________________  RADIOLOGY  I personally reviewed all radiographic images ordered to evaluate for the above acute complaints and reviewed radiology reports and findings.  These findings were personally discussed  with the patient.  Please see medical record for radiology report.  ____________________________________________   PROCEDURES  Procedure(s) performed:  Procedures    Critical Care performed: no ____________________________________________   INITIAL IMPRESSION / ASSESSMENT AND PLAN / ED COURSE  Pertinent labs & imaging results that were available during my care of the patient were reviewed by me and considered in my medical decision making (see chart for details).  DDX: appy, diverticulitis, colitis, msk strain, stone  Dean Tucker is a 21 y.o. who presents to the ED with acute right lower quadrant pain as described above. Blood work is reassuring. Urinalysis is  reassuring. He is afebrile hemodynamic stable. Exam as above. His description of symptoms C T imaging ordered to evaluate for acute abnormality shows none.  Patient was able to tolerate PO and was able to ambulate with a steady gait.  Have discussed with the patient and available family all diagnostics and treatments performed thus far and all questions were answered to the best of my ability. The patient demonstrates understanding and agreement with plan.       ____________________________________________   FINAL CLINICAL IMPRESSION(S) / ED DIAGNOSES  Final diagnoses:  Right lower quadrant abdominal pain      NEW MEDICATIONS STARTED DURING THIS VISIT:  New Prescriptions   No medications on file     Note:  This document was prepared using Dragon voice recognition software and may include unintentional dictation errors.    Willy Eddy, MD 11/05/16 [phone]

## 2016-11-05 NOTE — ED Triage Notes (Signed)
Pt to ed with c/o right side lower abd pain that started about 5 days ago.  Reports intermittent vomiting and also constipation but states he took laxatives and has had diarrhea since.

## 2017-08-18 IMAGING — CT CT ABD-PELV W/ CM
2 of 4 series · 16 of 46 positions shown, 18 images · IV contrast (APPLIED)
Comparison: None.

CLINICAL DATA: RIGHT lower abdominal pain. Constipation and
diarrhea.

EXAM:
CT ABDOMEN AND PELVIS WITH CONTRAST
TECHNIQUE: Multidetector CT imaging of the abdomen and pelvis was performed
using the standard protocol following bolus administration of
intravenous contrast.
CONTRAST:  125mL EEL8GA-HQQ IOPAMIDOL (EEL8GA-HQQ) INJECTION 61%

[Series 2: axial st · axial · 0.98mm/px · z∈[-995,-485]mm · 13 of 112 slices shown, 15 images]
[im 5/112  soft-tissue]
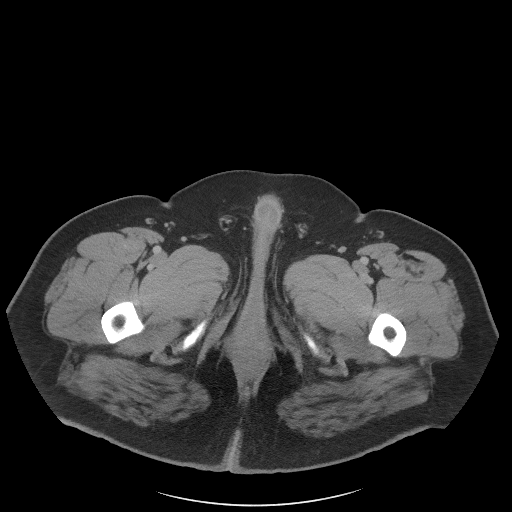
[im 5/112  bone]
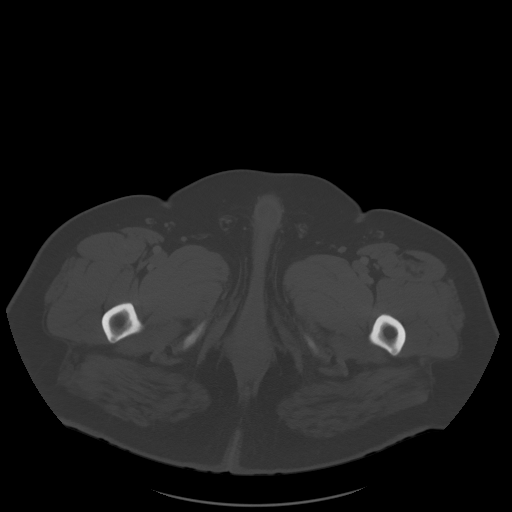
[im 14/112  soft-tissue]
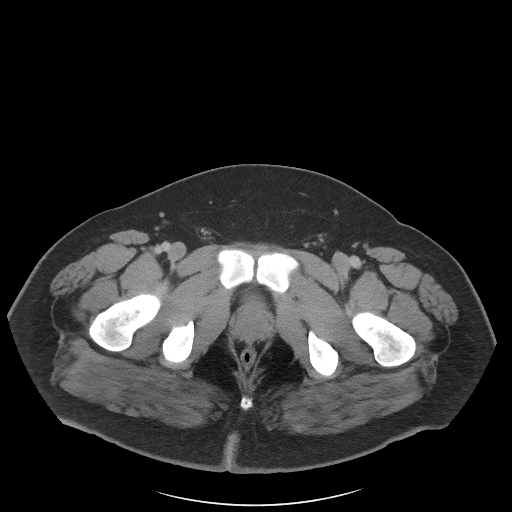
[im 24/112  soft-tissue]
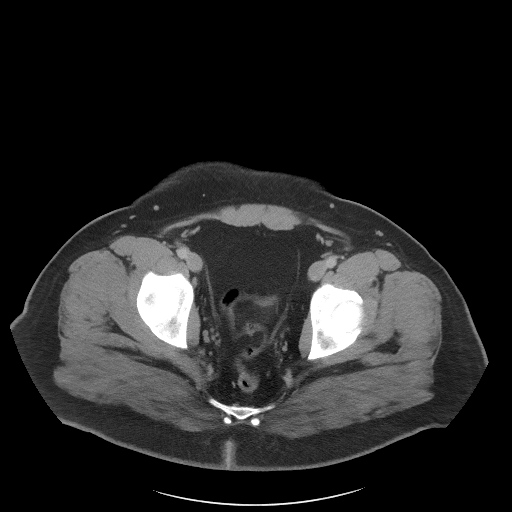
[im 33/112  soft-tissue]
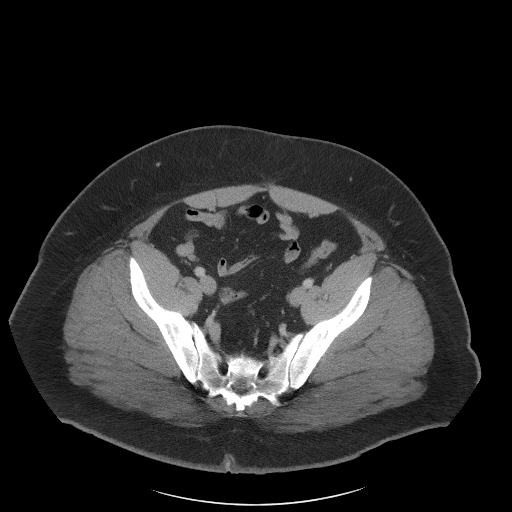
[im 38/112  soft-tissue]
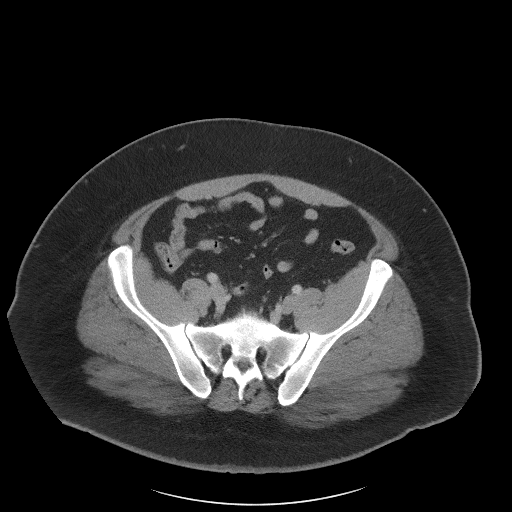
[im 47/112  soft-tissue]
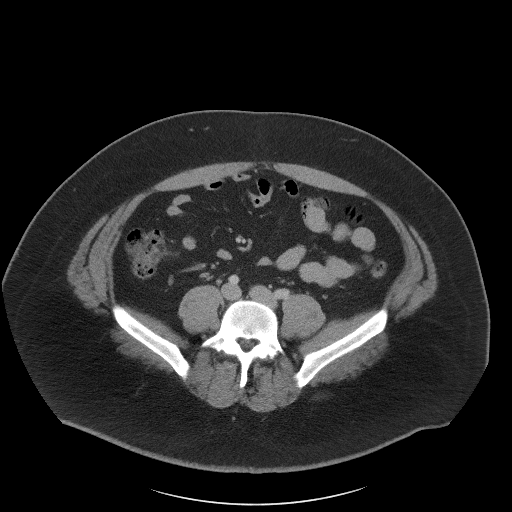
[im 56/112  soft-tissue]
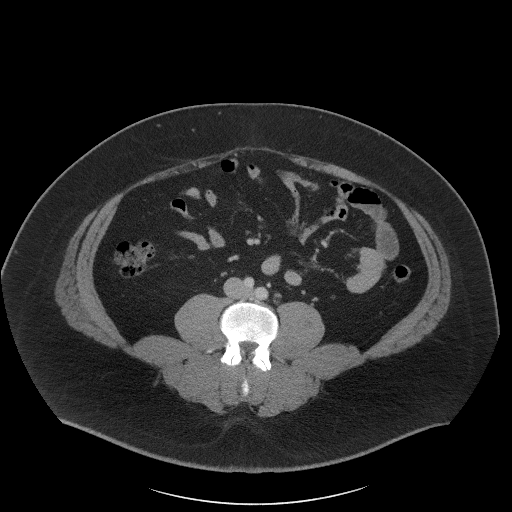
[im 65/112  soft-tissue]
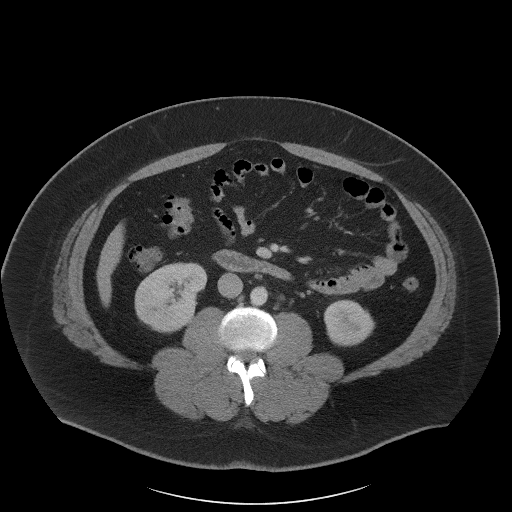
[im 75/112  soft-tissue]
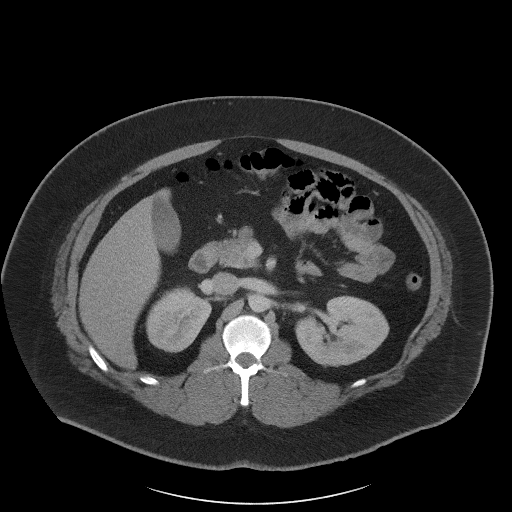
[im 75/112  bone]
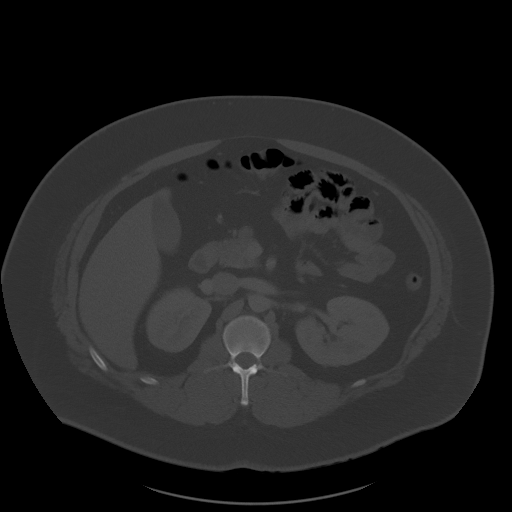
[im 79/112  soft-tissue]
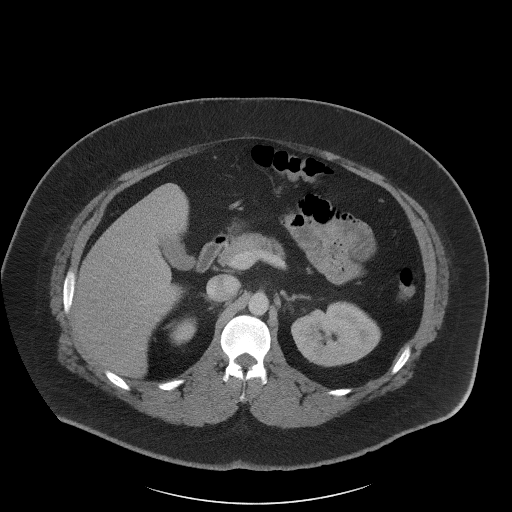
[im 88/112  soft-tissue]
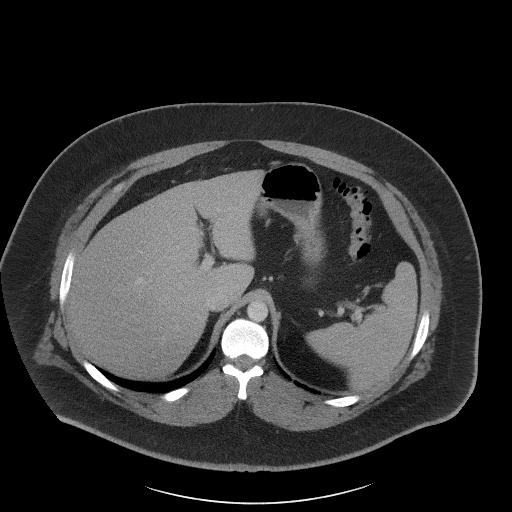
[im 98/112  soft-tissue]
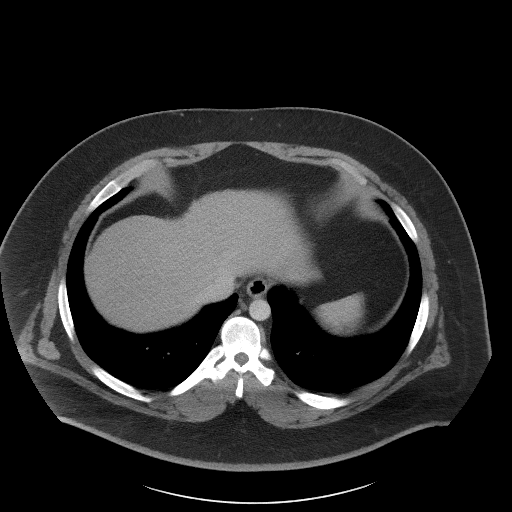
[im 107/112  soft-tissue]
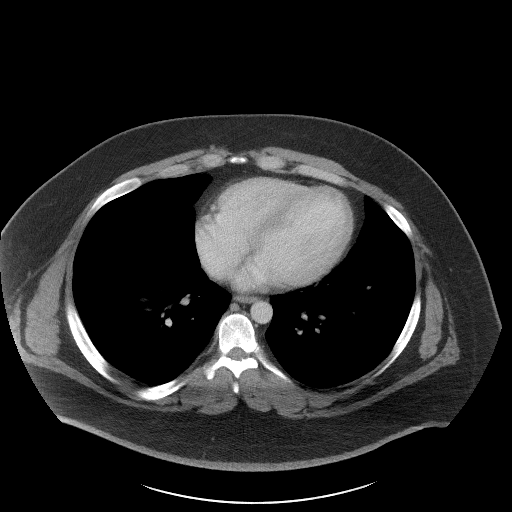

[Series 5: coronal st · coronal · 0.88mm/px · 3 of 113 slices shown]
[im 38/113  soft-tissue]
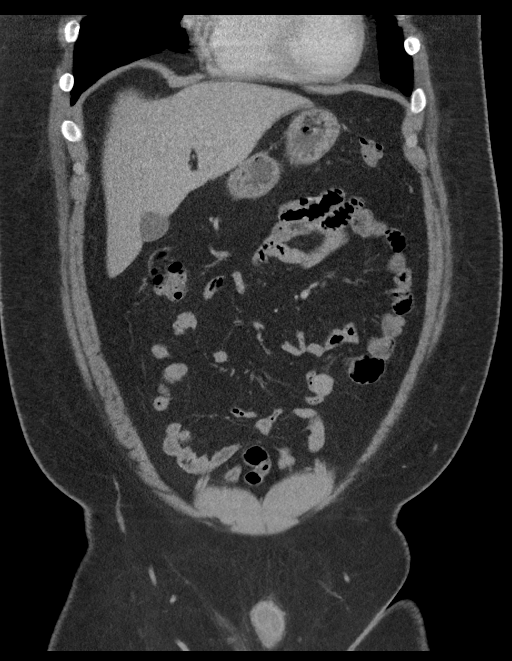
[im 50/113  soft-tissue]
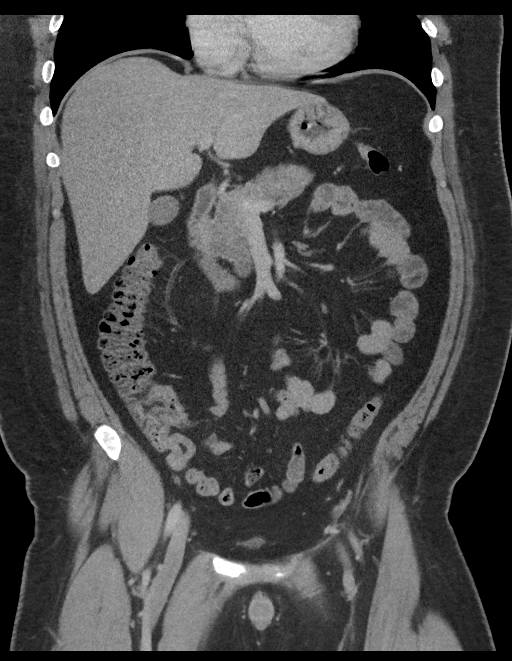
[im 63/113  soft-tissue]
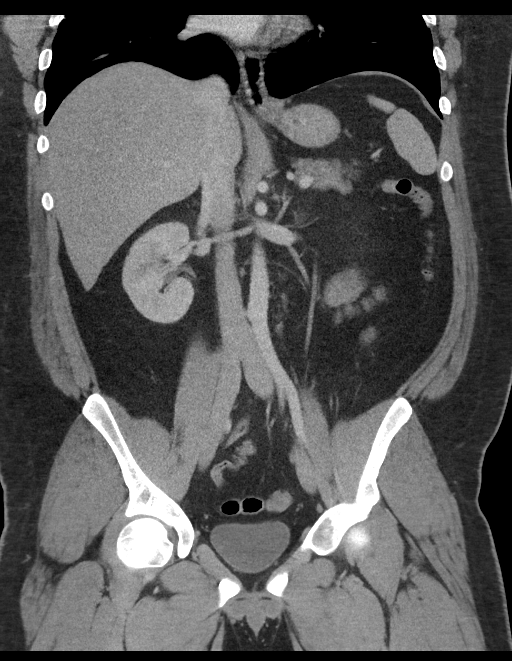

[16 of 46 positions shown; findings below may reference images not displayed]

FINDINGS: Lower chest: No acute abnormality.

Hepatobiliary: No focal liver abnormality is seen. No gallstones,
gallbladder wall thickening, or biliary dilatation.

Pancreas: Unremarkable. No pancreatic ductal dilatation or
surrounding inflammatory changes.

Spleen: Normal in size without focal abnormality.

Adrenals/Urinary Tract: Adrenal glands are unremarkable. Kidneys are
normal, without renal calculi, focal lesion, or hydronephrosis.
Bladder is unremarkable.

Stomach/Bowel: Stomach is within normal limits. Appendix appears
normal. No evidence of bowel wall thickening, distention, or
inflammatory changes.

Vascular/Lymphatic: No significant vascular findings are present. No
enlarged abdominal or pelvic lymph nodes.

Reproductive: Prostate is unremarkable.

Other: No abdominal wall hernia or abnormality. No abdominopelvic
ascites.

Musculoskeletal: No acute or significant osseous findings.
IMPRESSION: Negative exam. No evidence for cholelithiasis, nephrolithiasis, or
appendicitis.

## 2017-08-31 ENCOUNTER — Other Ambulatory Visit: Payer: Self-pay | Admitting: Family Medicine

## 2017-08-31 ENCOUNTER — Encounter: Payer: Self-pay | Admitting: Family Medicine

## 2017-08-31 ENCOUNTER — Ambulatory Visit (INDEPENDENT_AMBULATORY_CARE_PROVIDER_SITE_OTHER): Payer: BLUE CROSS/BLUE SHIELD | Admitting: Family Medicine

## 2017-08-31 VITALS — BP 146/88 | HR 86 | Temp 98.2°F | Resp 16 | Wt 362.2 lb

## 2017-08-31 DIAGNOSIS — Z0000 Encounter for general adult medical examination without abnormal findings: Secondary | ICD-10-CM

## 2017-08-31 DIAGNOSIS — N451 Epididymitis: Secondary | ICD-10-CM | POA: Diagnosis not present

## 2017-08-31 DIAGNOSIS — Z8639 Personal history of other endocrine, nutritional and metabolic disease: Secondary | ICD-10-CM

## 2017-08-31 DIAGNOSIS — E66813 Obesity, class 3: Secondary | ICD-10-CM

## 2017-08-31 DIAGNOSIS — Z6841 Body Mass Index (BMI) 40.0 and over, adult: Secondary | ICD-10-CM

## 2017-08-31 LAB — POCT URINALYSIS DIPSTICK
Bilirubin, UA: NEGATIVE
Blood, UA: NEGATIVE
Glucose, UA: NEGATIVE
Ketones, UA: NEGATIVE
Leukocytes, UA: NEGATIVE
NITRITE UA: NEGATIVE
PH UA: 5 (ref 5.0–8.0)
Protein, UA: NEGATIVE
Spec Grav, UA: 1.02 (ref 1.010–1.025)
UROBILINOGEN UA: 0.2 U/dL

## 2017-08-31 MED ORDER — CIPROFLOXACIN HCL 500 MG PO TABS: 500.0000 mg | ORAL_TABLET | Freq: Two times a day (BID) | ORAL | 0 refills | Status: DC

## 2017-08-31 NOTE — Progress Notes (Signed)
Subjective:     Patient ID: Dean Tucker, male   DOB: 07/23/1996, 23 y.o.   MRN: [id number] Chief Complaint  Patient presents with  . Annual Exam    Patient comes in office today for his annual physical, patient would like to address pain in his right testicle for the past week and half that has gradually got worse in the past dew days, patient denies swelling but states that he feels a mass. Patient reports that his brother was recently diagnosed with testicular cancer and has concerns, patient also wanted to address frequent headaches that have been occuring at least 4x out of the week. Patient reports that he takes otc Ibuprofen and Advil .    HPI States he is working for a [employer] 40 hours/week (5PM-12 Midnight) Reports no regular exercise except related to work and feels he overeats.  Review of Systems General: Feeling well HEENT: no regular dental visits and encouraged dental exam. Cardiovascular: states he getd short of breath climbing up stairs but attributes that to his obesity. Has has a right sided "gas pain" in his right chest on one occasion. GI: occasional heartburn, no change in bowel habits. Concerned about intermittent throbbing pain of his right testicle which has interrupted his work day on occasion for the last 2 weeks. Believes he has on knot on the back of his testicle. GU: nocturia x 0, no change in bladder habits. Not sexually active. Neuro: Describes headaches as throbbing usually over the frontal part of his head. No associated nausea or change in vision. Wears glasses but no recent eye exam. Consumes up to 60 oz of soda daily. Psychiatric: Feels his mental health is ok but scores 16 on PHQ-9. Does report difficulties falling asleep. Musculoskeletal: States top of his right foot will get sore after working on his feet all shift.    Objective:   Physical Exam  Constitutional: He appears well-developed and well-nourished. No distress.  Eyes: PERRLA Neck: no  thyromegaly, tenderness or nodules, no cervical adenopathy ENT: Right ear canal with excessive cerumen; Left TM intact without inflammation. No tonsillar enlargement or exudate, Lungs: Clear Heart : RRR without murmur or gallop Abd: bowel sounds present, soft, non-tender, no organomegaly GU: Mild tenderness on right testicle palpation but no discrete mass appreciated. Extremities: no edema, Right pedal pulses intact, no areas of tenderness or erythema are identified on the dorsum of his foot. PF/DF 5/5 Skin: no atypical lesions noted on his back     Assessment:    1. Annual physical exam - Comprehensive metabolic panel  2. Epididymitis, right - POCT urinalysis dipstick - ciprofloxacin (CIPRO) 500 MG tablet; Take 1 tablet (500 mg total) by mouth 2 (two) times daily.  Dispense: 20 tablet; Refill: 0  3. H/O elevated lipids - Lipid panel  4. Class 3 severe obesity due to excess calories without serious comorbidity with body mass index (BMI) of 45.0 to 49.9 in adult [facility](HCC)    Plan:    Further f/u pending lab results and in 4 weeks. Encourage to decrease caffeine and update his eye exam. Encouraged flu shot (supply gone here). Call if testicle pain not improving for urology referral. At f/u visit will discuss obesity and address sleep/mental health issues.

## 2017-08-31 NOTE — Patient Instructions (Addendum)
We will call you with the lab results. Do get an eye exam and decrease caffeine drinks to help minimize headache triggers. Consider getting a flu shot at a pharmacy. Follow up with me in 4 weeks or call sooner if testicle pain not improving.

## 2017-09-01 ENCOUNTER — Telehealth: Payer: Self-pay

## 2017-09-01 LAB — LIPID PANEL
CHOL/HDL RATIO: 7.2 ratio — AB (ref 0.0–5.0)
Cholesterol, Total: 246 mg/dL — ABNORMAL HIGH (ref 100–199)
HDL: 34 mg/dL — AB (ref >39)
LDL Calculated: 177 mg/dL — ABNORMAL HIGH (ref 0–99)
TRIGLYCERIDES: 173 mg/dL — AB (ref 0–149)
VLDL Cholesterol Cal: 35 mg/dL (ref 5–40)

## 2017-09-01 LAB — COMPREHENSIVE METABOLIC PANEL
A/G RATIO: 1.7 (ref 1.2–2.2)
ALT: 34 IU/L (ref 0–44)
AST: 18 IU/L (ref 0–40)
Albumin: 4.6 g/dL (ref 3.5–5.5)
Alkaline Phosphatase: 65 IU/L (ref 39–117)
BILIRUBIN TOTAL: 0.4 mg/dL (ref 0.0–1.2)
BUN/Creatinine Ratio: 17 (ref 9–20)
BUN: 12 mg/dL (ref 6–20)
CHLORIDE: 103 mmol/L (ref 96–106)
CO2: 21 mmol/L (ref 20–29)
Calcium: 9.7 mg/dL (ref 8.7–10.2)
Creatinine, Ser: 0.72 mg/dL — ABNORMAL LOW (ref 0.76–1.27)
GFR calc non Af Amer: 132 mL/min/{1.73_m2} (ref >59)
GFR, EST AFRICAN AMERICAN: 153 mL/min/{1.73_m2} (ref >59)
GLOBULIN, TOTAL: 2.7 g/dL (ref 1.5–4.5)
Glucose: 85 mg/dL (ref 65–99)
POTASSIUM: 4.3 mmol/L (ref 3.5–5.2)
SODIUM: 140 mmol/L (ref 134–144)
Total Protein: 7.3 g/dL (ref 6.0–8.5)

## 2017-09-01 NOTE — Telephone Encounter (Signed)
-----   Message from Anola Gurneyobert Chauvin, [place] sent at 09/01/2017  7:38 AM EST ----- Cholesterol is elevated but other labs look good including your sugar. We can discuss this more at your next office visit.

## 2017-09-01 NOTE — Telephone Encounter (Signed)
[id number] 

## 2017-09-02 NOTE — Telephone Encounter (Signed)
[id number] 

## 2017-09-06 NOTE — Telephone Encounter (Signed)
[id number] 

## 2017-09-07 NOTE — Telephone Encounter (Signed)
Unable to reach patient will mail letter home. KW 

## 2017-09-21 NOTE — Telephone Encounter (Signed)
Pt called back and was advised of results. He made his 4 week FU for next Wednesday, March 6th. He is concerned of possible cancer due to testicular pain and a cyst in the teste. Offered appointment tomorrow, but pt declined stating his schedule was busy.

## 2017-09-28 ENCOUNTER — Ambulatory Visit: Payer: BLUE CROSS/BLUE SHIELD | Admitting: Family Medicine

## 2017-09-28 ENCOUNTER — Encounter: Payer: Self-pay | Admitting: Family Medicine

## 2017-09-28 VITALS — BP 122/86 | HR 76 | Temp 97.8°F | Resp 16 | Ht 73.5 in | Wt 364.0 lb

## 2017-09-28 DIAGNOSIS — N451 Epididymitis: Secondary | ICD-10-CM | POA: Diagnosis not present

## 2017-09-28 DIAGNOSIS — L219 Seborrheic dermatitis, unspecified: Secondary | ICD-10-CM

## 2017-09-28 DIAGNOSIS — F321 Major depressive disorder, single episode, moderate: Secondary | ICD-10-CM | POA: Diagnosis not present

## 2017-09-28 DIAGNOSIS — E782 Mixed hyperlipidemia: Secondary | ICD-10-CM

## 2017-09-28 MED ORDER — CIPROFLOXACIN HCL 500 MG PO TABS: 500.0000 mg | ORAL_TABLET | Freq: Two times a day (BID) | ORAL | 0 refills | Status: DC

## 2017-09-28 MED ORDER — SERTRALINE HCL 50 MG PO TABS: 50.0000 mg | ORAL_TABLET | Freq: Every day | ORAL | 1 refills | Status: DC

## 2017-09-28 NOTE — Patient Instructions (Signed)
Dyslipidemia Dyslipidemia is an imbalance of waxy, fat-like substances (lipids) in the blood. The body needs lipids in small amounts. Dyslipidemia often involves a high level of cholesterol or triglycerides, which are types of lipids. Common forms of dyslipidemia include:  High levels of bad cholesterol (LDL cholesterol). LDL is the type of cholesterol that causes fatty deposits (plaques) to build up in the blood vessels that carry blood away from your heart (arteries).  Low levels of good cholesterol (HDL cholesterol). HDL cholesterol is the type of cholesterol that protects against heart disease. High levels of HDL remove the LDL buildup from arteries.  High levels of triglycerides. Triglycerides are a fatty substance in the blood that is linked to a buildup of plaques in the arteries.  You can develop dyslipidemia because of the genes you are born with (primary dyslipidemia) or changes that occur during your life (secondary dyslipidemia), or as a side effect of certain medical treatments. What are the causes? Primary dyslipidemia is caused by changes (mutations) in genes that are passed down through families (inherited). These mutations cause several types of dyslipidemia. Mutations can result in disorders that make the body produce too much LDL cholesterol or triglycerides, or not enough HDL cholesterol. These disorders may lead to heart disease, arterial disease, or stroke at an early age. Causes of secondary dyslipidemia include certain lifestyle choices and diseases that lead to dyslipidemia, such as:  Eating a diet that is high in animal fat.  Not getting enough activity or exercise (having a sedentary lifestyle).  Having diabetes, kidney disease, liver disease, or thyroid disease.  Drinking large amounts of alcohol.  Using certain types of drugs.  What increases the risk? You may be at greater risk for dyslipidemia if you are an older man or if you are a woman who has gone through  menopause. Other risk factors include:  Having a family history of dyslipidemia.  Taking certain medicines, including birth control pills, steroids, some diuretics, beta-blockers, and some medicines forHIV.  Smoking cigarettes.  Eating a high-fat diet.  Drinking large amounts of alcohol.  Having certain medical conditions such as diabetes, polycystic ovary syndrome (PCOS), pregnancy, kidney disease, liver disease, or hypothyroidism.  Not exercising regularly.  Being overweight or obese with too much belly fat.  What are the signs or symptoms? Dyslipidemia does not usually cause any symptoms. Very high lipid levels can cause fatty bumps under the skin (xanthomas) or a white or gray ring around the black center (pupil) of the eye. Very high triglyceride levels can cause inflammation of the pancreas (pancreatitis). How is this diagnosed? Your health care provider may diagnose dyslipidemia based on a routine blood test (fasting blood test). Because most people do not have symptoms of the condition, this blood testing (lipid profile) is done on adults age 40 and older and is repeated every 5 years. This test checks:  Total cholesterol. This is a measure of the total amount of cholesterol in your blood, including LDL cholesterol, HDL cholesterol, and triglycerides. A healthy number is below 200.  LDL cholesterol. The target number for LDL cholesterol is different for each person, depending on individual risk factors. For most people, a number below 100 is healthy. Ask your health care provider what your LDL cholesterol number should be.  HDL cholesterol. An HDL level of 60 or higher is best because it helps to protect against heart disease. A number below 42 for men or below 39 for women increases the risk for heart disease.  Triglycerides. A  healthy triglyceride number is below 150.  If your lipid profile is abnormal, your health care provider may do other blood tests to get more  information about your condition. How is this treated? Treatment depends on the type of dyslipidemia that you have and your other risk factors for heart disease and stroke. Your health care provider will have a target range for your lipid levels based on this information. For many people, treatment starts with lifestyle changes, such as diet and exercise. Your health care provider may recommend that you:  Get regular exercise.  Make changes to your diet.  Quit smoking if you smoke.  If diet changes and exercise do not help you reach your goals, your health care provider may also prescribe medicine to lower lipids. The most commonly prescribed type of medicine lowers your LDL cholesterol (statin drug). If you have a high triglyceride level, your provider may prescribe another type of drug (fibrate) or an omega-3 fish oil supplement, or both. Follow these instructions at home:  Take over-the-counter and prescription medicines only as told by your health care provider. This includes supplements.  Get regular exercise. Start an aerobic exercise and strength training program as told by your health care provider. Ask your health care provider what activities are safe for you. Your health care provider may recommend: ? 30 minutes of aerobic activity 4-6 days a week. Brisk walking is an example of aerobic activity. ? Strength training 2 days a week.  Eat a healthy diet as told by your health care provider. This can help you reach and maintain a healthy weight, lower your LDL cholesterol, and raise your HDL cholesterol. It may help to work with a diet and nutrition specialist (dietitian) to make a plan that is right for you. Your dietitian or health care provider may recommend: ? Limiting your calories, if you are overweight. ? Eating more fruits, vegetables, whole grains, fish, and lean meats. ? Limiting saturated fat, trans fat, and cholesterol.  Follow instructions from your health care provider  or dietitian about eating or drinking restrictions.  Limit alcohol intake to no more than one drink per day for nonpregnant women and two drinks per day for men. One drink equals 12 oz of beer, 5 oz of wine, or 1 oz of hard liquor.  Do not use any products that contain nicotine or tobacco, such as cigarettes and e-cigarettes. If you need help quitting, ask your health care provider.  Keep all follow-up visits as told by your health care provider. This is important. Contact a health care provider if:  You are having trouble sticking to your exercise or diet plan.  You are struggling to quit smoking or control your use of alcohol. Summary  Dyslipidemia is an imbalance of waxy, fat-like substances (lipids) in the blood. The body needs lipids in small amounts. Dyslipidemia often involves a high level of cholesterol or triglycerides, which are types of lipids.  Treatment depends on the type of dyslipidemia that you have and your other risk factors for heart disease and stroke.  For many people, treatment starts with lifestyle changes, such as diet and exercise. Your health care provider may also prescribe medicine to lower lipids. This information is not intended to replace advice given to you by your health care provider. Make sure you discuss any questions you have with your health care provider. Document Released: 07/17/2013 Document Revised: 03/08/2016 Document Reviewed: 03/08/2016 Elsevier Interactive Patient Education  [employer].  Discussed use of hydrocortisone cream  twice daily to face rash Discontinue when rash goes away.

## 2017-09-28 NOTE — Progress Notes (Signed)
Subjective:     Patient ID: Dean Tucker, male   DOB: 31-Jul-1995, 22 y.o.   MRN: [id number] Chief Complaint  Patient presents with  . Obesity    Patient returns for 4 week follow up fcrom 08/31/17.  Marland Kitchen Testicle Pain    Patient states that pain in right testicle has returned, he states that he stopped Cipro that was prescribed 08/31/17 after 3 days of treatment stating that his symptoms had cleared. Patient reports that symptoms immediatly returned andhe finished the rest of antibiotic days later. Patient reports that pain in right testicle is usually a throbbing feeling and states that when he lifts his hands over his head he has a sharp pain in right testicle. Patient also has noticed  groin pain on the right side.   . Skin Problem    Patient would like to address skin redness on the right side of his face thatt has been present for one week.    HPI Reports he has developed a symptomatic linear rash on the right side of his face. Would like to try another course of Cipro before being referred to urology for testicle pain. Admits to being depressed but not suicidal in the last two weeks: "I feel like I have let everyone down." Discussed elevated cholesterol from recent labs with him. States his exercise is limited right now due to the testicle pain.Does report headaches have improved on decreased caffeine.  Review of Systems     Objective:   Physical Exam  Constitutional: He appears well-developed and well-nourished. No distress.  Skin:  Linear rash in right nasolabial fold. No scaling noted today  Psychiatric: He has a normal mood and affect. His behavior is normal.       Assessment:    1. Epididymitis, right - ciprofloxacin (CIPRO) 500 MG tablet; Take 1 tablet (500 mg total) by mouth 2 (two) times daily.  Dispense: 20 tablet; Refill: 0  2. Depression, major, single episode, moderate (HCC) - sertraline (ZOLOFT) 50 MG tablet; Take 1 tablet (50 mg total) by mouth daily. Start at 1/2 pill  for the first 5 days.  Dispense: 30 tablet; Refill: 1  3. Seborrheic dermatitis: start hydrocortisone cream otc  4. Hyperlipidemia, mixed: handout provided    Plan:    F/u in 2 weeks. Consider urology referral if not improved. Consider addition of sleep medication if needed.

## 2017-10-12 ENCOUNTER — Ambulatory Visit: Payer: Self-pay | Admitting: Family Medicine

## 2017-12-09 ENCOUNTER — Other Ambulatory Visit: Payer: Self-pay | Admitting: Family Medicine

## 2017-12-09 DIAGNOSIS — F321 Major depressive disorder, single episode, moderate: Secondary | ICD-10-CM

## 2017-12-31 ENCOUNTER — Other Ambulatory Visit: Payer: Self-pay | Admitting: Family Medicine

## 2017-12-31 DIAGNOSIS — F321 Major depressive disorder, single episode, moderate: Secondary | ICD-10-CM

## 2018-01-06 ENCOUNTER — Ambulatory Visit (INDEPENDENT_AMBULATORY_CARE_PROVIDER_SITE_OTHER): Payer: BLUE CROSS/BLUE SHIELD | Admitting: Family Medicine

## 2018-01-06 ENCOUNTER — Other Ambulatory Visit: Payer: Self-pay | Admitting: Family Medicine

## 2018-01-06 ENCOUNTER — Ambulatory Visit: Payer: BLUE CROSS/BLUE SHIELD | Admitting: Family Medicine

## 2018-01-06 ENCOUNTER — Encounter: Payer: Self-pay | Admitting: Family Medicine

## 2018-01-06 VITALS — BP 132/98 | HR 88 | Temp 98.4°F | Resp 14 | Wt 356.0 lb

## 2018-01-06 DIAGNOSIS — N528 Other male erectile dysfunction: Secondary | ICD-10-CM

## 2018-01-06 DIAGNOSIS — G43009 Migraine without aura, not intractable, without status migrainosus: Secondary | ICD-10-CM

## 2018-01-06 NOTE — Progress Notes (Signed)
  Subjective:     Patient ID: Dean Tucker, male   DOB: 12/21/1995, 22 y.o.   MRN: [id number] Chief Complaint  Patient presents with  . Headache    pt reports that he woke up with a headache this morning and he has been nauseated all day today also. he reports that he did not eat anything yesterday but he did not have much of an appetite. He has had some dry heaves/gaging and some upper to middle abdominal pain. no vomiting, diarrhea, fevers or chills.   . Nausea   HPI States headache is abating but does need a work excuse for today. Reports triggers include sleeping too much. Also has concerns about sexual performance. States he had trouble getting an erection with a new partner. Does get nocturnal erections.  Review of Systems     Objective:   Physical Exam  Constitutional: He appears well-developed and well-nourished. He does not appear ill. No distress.       Assessment:    1. Migraine without aura and without status migrainosus, not intractable: Encourage eye exam  2. Other male erectile dysfunction; suspect performance anxiety but will have him decrease sertraline to 1/2 pill and see if it helps.     Plan:    work excuse for today.

## 2018-01-06 NOTE — Patient Instructions (Signed)
Decrease sertraline to 1/2 pill daily (25 mg) and see if that helps.

## 2018-02-16 ENCOUNTER — Encounter: Payer: Self-pay | Admitting: Family Medicine

## 2018-02-16 ENCOUNTER — Ambulatory Visit: Payer: BLUE CROSS/BLUE SHIELD | Admitting: Family Medicine

## 2018-02-16 VITALS — BP 160/110 | HR 86 | Temp 98.1°F | Resp 16 | Wt 365.2 lb

## 2018-02-16 DIAGNOSIS — K529 Noninfective gastroenteritis and colitis, unspecified: Secondary | ICD-10-CM

## 2018-02-16 DIAGNOSIS — R0683 Snoring: Secondary | ICD-10-CM | POA: Diagnosis not present

## 2018-02-16 NOTE — Patient Instructions (Addendum)
Try imodium for a day or two to slow down your stool frequency. Return the Epworth screen when you are done.

## 2018-02-16 NOTE — Progress Notes (Signed)
  Subjective:     Patient ID: Dean Tucker, male   DOB: 09/15/1995, 22 y.o.   MRN: [id number] Chief Complaint  Patient presents with  . Diarrhea    Patient comes in office today with complaints of diarrhea and mid abdonminal pain for the past 3 days. Patient denies symptoms of dfever, fatigue or weakness.   . Sleep Apnea    Patient reports for the past 2 years he has moaned while sleeping and in the past 3 weeks he has noticed that he stops breathing when sleep. Patient states that he has concerns of a sleep disorder.    HPI States he has started have increased frequency of formed stools from 3-4 x day to 6-7 x day. No nausea or vomiting. 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 Also states he will gasp for breath when he awakens at night at times. Denies specific snoring but is told he moans on exhalation.  Review of Systems     Objective:   Physical Exam  Constitutional: He appears well-developed and well-nourished. No distress.  HENT:  Moderately enlarged tonsils.  Abdominal: Soft. There is no tenderness.       Assessment:    1. Frequent stools: probable anxiety equivalent. Try imodium for a day or two.  2. Snoring: Epworth screen provided. Consider ENT referral.    Plan:    Further f/u pending Epworth screen or not improving.

## 2018-03-07 ENCOUNTER — Encounter: Payer: Self-pay | Admitting: Family Medicine

## 2018-03-07 ENCOUNTER — Ambulatory Visit: Payer: BLUE CROSS/BLUE SHIELD | Admitting: Family Medicine

## 2018-03-07 VITALS — BP 170/102 | HR 90 | Temp 97.8°F | Resp 16 | Wt 368.2 lb

## 2018-03-07 DIAGNOSIS — R030 Elevated blood-pressure reading, without diagnosis of hypertension: Secondary | ICD-10-CM | POA: Diagnosis not present

## 2018-03-07 DIAGNOSIS — R0683 Snoring: Secondary | ICD-10-CM | POA: Diagnosis not present

## 2018-03-07 DIAGNOSIS — R42 Dizziness and giddiness: Secondary | ICD-10-CM | POA: Diagnosis not present

## 2018-03-07 NOTE — Progress Notes (Addendum)
  Subjective:     Patient ID: Dean Tucker, male   DOB: 05/04/1996, 22 y.o.   MRN: [id number] Chief Complaint  Patient presents with  . Dizziness    Patient comes into office today with concerns of lightheadiness and dizziness over the past 24hrs.  . Sleep Apnea    Patient would like to address sleep apnea, he states he was unable to fill out epworth screen because of his work schedule. Patient states he is still having difficulty breathing when laying on his back and reports that he stops breathing in his sleep.    HPI Sates he sweats a lot on his job and tries to keep up with his fluids drinking two 48 oz jugs of water during his shift at [employer]. States if he sits down the dizziness passes. Reports that his mother and stepfather are getting separated and that has upset him. He now has moved into a new apartment with his brother. Epworth screen in the office today is 18.  Review of Systems  Respiratory:       Snoring  Cardiovascular: Negative for chest pain and palpitations.       Objective:   Physical Exam  Constitutional: He appears well-developed and well-nourished. No distress.  Cardiovascular: Normal rate and regular rhythm.  Pulmonary/Chest: Breath sounds normal.  Musculoskeletal: He exhibits no edema (of lower extremities).  Psychiatric: He has a normal mood and affect. His behavior is normal.       Assessment:    1. Snoring - Ambulatory referral to Sleep Studies  2. Blood pressure elevated without history of HTN  3. Dizziness    Plan:    Suggested adding Gatorade to his drink regimen. Recheck bp tomorrow. Work excuse for today provided.

## 2018-03-07 NOTE — Patient Instructions (Signed)
Add Gatorade along with the water you drink. Come in for a nurse bp check tomorrow. I will submit a referral for a sleep study.

## 2018-05-03 ENCOUNTER — Other Ambulatory Visit: Payer: Self-pay

## 2018-05-03 ENCOUNTER — Ambulatory Visit: Payer: BLUE CROSS/BLUE SHIELD | Admitting: Family Medicine

## 2018-05-03 ENCOUNTER — Encounter: Payer: Self-pay | Admitting: Family Medicine

## 2018-05-03 VITALS — BP 172/119 | HR 88 | Temp 97.9°F | Ht 74.0 in | Wt 370.2 lb

## 2018-05-03 DIAGNOSIS — J069 Acute upper respiratory infection, unspecified: Secondary | ICD-10-CM | POA: Diagnosis not present

## 2018-05-03 DIAGNOSIS — I10 Essential (primary) hypertension: Secondary | ICD-10-CM | POA: Insufficient documentation

## 2018-05-03 MED ORDER — HYDROCHLOROTHIAZIDE 25 MG PO TABS: 25.0000 mg | ORAL_TABLET | Freq: Every day | ORAL | 0 refills | Status: DC

## 2018-05-03 NOTE — Progress Notes (Signed)
  Subjective:     Patient ID: Dean Tucker, male   DOB: March 12, 1996, 22 y.o.   MRN: [id number] Chief Complaint  Patient presents with  . Sore Throat    runny nose, migraine, nausea, congestion with yellow mucus.  pt reports that the nausea started on 05/02/18 and the flu symptoms started on 04/30/18   HPI Reports cold sx onset 04/30/18. Lost to f/u for elevated blood pressure. Has not taken any medication for his sx. Wishes work excuse for yesterday and today.  Review of Systems     Objective:   Physical Exam  Constitutional: He appears well-developed and well-nourished. He appears ill. No distress.  Ears: T.M's both obscured by cerumen Throat: mild tonsillar enlargement without erythema or exudate Neck: no cervical adenopathy Lungs: clear     Assessment:    1. Essential hypertension: start HCTZ  2. Viral upper respiratory tract infection    Plan:    Discussed avoiding decongestants and use of otc medication. Samples of Mucinex, Delsym, and saline irrigation provided. Work excuse and Good Rx card provided.

## 2018-05-03 NOTE — Patient Instructions (Addendum)
Discussed use of Benadryl at night for post nasal drainage; Claritin ( generic ok) and saline irrigation during the day for nasal drip/congestion; and Delsym for cough. Expect to start feeling better as you get through the weekend.

## 2018-05-17 ENCOUNTER — Other Ambulatory Visit: Payer: Self-pay | Admitting: Family Medicine

## 2018-05-18 ENCOUNTER — Other Ambulatory Visit: Payer: Self-pay | Admitting: Family Medicine

## 2018-05-22 ENCOUNTER — Ambulatory Visit: Payer: Self-pay | Admitting: Family Medicine

## 2018-07-21 ENCOUNTER — Ambulatory Visit: Payer: Self-pay | Admitting: Family Medicine

## 2018-09-21 ENCOUNTER — Ambulatory Visit: Payer: BLUE CROSS/BLUE SHIELD | Admitting: Family Medicine

## 2018-09-21 ENCOUNTER — Encounter: Payer: Self-pay | Admitting: Family Medicine

## 2018-09-21 VITALS — BP 150/110 | HR 81 | Temp 98.3°F | Resp 16 | Wt 365.2 lb

## 2018-09-21 DIAGNOSIS — S39012A Strain of muscle, fascia and tendon of lower back, initial encounter: Secondary | ICD-10-CM | POA: Diagnosis not present

## 2018-09-21 DIAGNOSIS — I10 Essential (primary) hypertension: Secondary | ICD-10-CM

## 2018-09-21 MED ORDER — PREDNISONE 10 MG PO TABS: ORAL_TABLET | ORAL | 0 refills | Status: DC

## 2018-09-21 MED ORDER — HYDROCHLOROTHIAZIDE 25 MG PO TABS: 25.0000 mg | ORAL_TABLET | Freq: Every day | ORAL | 0 refills | Status: DC

## 2018-09-21 MED ORDER — CYCLOBENZAPRINE HCL 5 MG PO TABS: 5.0000 mg | ORAL_TABLET | Freq: Three times a day (TID) | ORAL | 0 refills | Status: DC | PRN

## 2018-09-21 NOTE — Progress Notes (Signed)
  Subjective:     Patient ID: Dean Tucker, male   DOB: March 18, 1996, 23 y.o.   MRN: [id number] Chief Complaint  Patient presents with  . Back Pain    Patient comes in office today with complaints of lower back pain for over 2 weeks, patient reports that pain has been radiating down the right side of his leg. Patient reports that he has a tingling sensation down leg and reports that pain is more noticeable when sitting rather than laying flat. Patient has tried otc Ibuprofen and Aleve   HPI He has continued to work at a [employer]. 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 He has continued to work despite his sx. Did not start bp medication ordered last October. Accompanied by his brother who state they have upset lately over the separation of their parents.  Review of Systems     Objective:   Physical Exam Constitutional:      General: He is not in acute distress (mild discomfort when changing  positions). Musculoskeletal:     Comments: Muscle strength in lower extremities 5/5. SLR's to 60 degrees without radiation of back pain. Localizes to his lower lumbar vertebra. Minimal tenderness on palpation.  Neurological:     Mental Status: He is alert.        Assessment:    1. Strain of lumbar region, initial encounter: will avoid nsaid's due to elevated bp. - predniSONE (DELTASONE) 10 MG tablet; Taper daily as follows: 6 pills, 5, 4, 3, 2, 1  Dispense: 21 tablet; Refill: 0 - cyclobenzaprine (FLEXERIL) 5 MG tablet; Take 1 tablet (5 mg total) by mouth 3 (three) times daily as needed for muscle spasms.  Dispense: 21 tablet; Refill: 0  2. Essential hypertension - hydrochlorothiazide (HYDRODIURIL) 25 MG tablet; Take 1 tablet (25 mg total) by mouth daily.  Dispense: 30 tablet; Refill: 0    Plan:    Work excuse for 2/27-2/28 with light duty requested until 09/28/18. Discussed use of rx medication and Tylenol F//u with new provider due to my retirement in 2-3  weeks.

## 2018-09-21 NOTE — Patient Instructions (Signed)
Muscle relaxants can make you sleepy so you may want to use them only at night.In  addition to the prednisone may use Tylenol up to 3000 mg. Day for pain. Start the bp medication. Do follow up with Dean Tucker or Dr. B for about your hypertension in 2-3 weeks.

## 2018-11-10 ENCOUNTER — Telehealth: Payer: Self-pay

## 2018-11-10 DIAGNOSIS — I10 Essential (primary) hypertension: Secondary | ICD-10-CM

## 2018-11-10 MED ORDER — HYDROCHLOROTHIAZIDE 25 MG PO TABS: 25.0000 mg | ORAL_TABLET | Freq: Every day | ORAL | 0 refills | Status: DC

## 2018-11-10 NOTE — Telephone Encounter (Signed)
Patient requesting a refill on the following medication hydrochlorothiazide (HYDRODIURIL) 25 MG tablet   to [facility] on [address]  Patient was advised that he also need to establish care with another provider since he used to see Nadine Counts.

## 2018-11-10 NOTE — Telephone Encounter (Signed)
Refilled x 3 months due to covid. Must establish prior to more refills

## 2018-11-21 ENCOUNTER — Other Ambulatory Visit: Payer: Self-pay

## 2018-11-21 ENCOUNTER — Ambulatory Visit: Payer: BLUE CROSS/BLUE SHIELD | Admitting: Family Medicine

## 2018-11-21 ENCOUNTER — Encounter: Payer: Self-pay | Admitting: Family Medicine

## 2018-11-21 VITALS — BP 142/90 | HR 83 | Temp 98.3°F | Wt 374.0 lb

## 2018-11-21 DIAGNOSIS — G43909 Migraine, unspecified, not intractable, without status migrainosus: Secondary | ICD-10-CM

## 2018-11-21 DIAGNOSIS — E782 Mixed hyperlipidemia: Secondary | ICD-10-CM

## 2018-11-21 DIAGNOSIS — I10 Essential (primary) hypertension: Secondary | ICD-10-CM | POA: Diagnosis not present

## 2018-11-21 NOTE — Progress Notes (Signed)
Dean Tucker  MRN: [id number] DOB: March 30, 1996  Subjective:  HPI   The patient is a 23 year old male who presents for evaluation of headaches.  He states that it started yesterday.   The patient reports that he has had a history of headaches prior to getting glasses but since obtaining the glasses years ago he has not had a problem until now.   He descries the headache as being from temple to temple across the front of his forehead.  He reports having a "stopped up" nose 4 days ago but denies any now.  Patient Active Problem List   Diagnosis Date Noted  . Essential hypertension 05/03/2018  . ADD (attention deficit disorder) 06/18/2015  . H/O manic depressive disorder 06/18/2015  . Hyperlipidemia, mixed 06/18/2015  . Obesity 06/18/2015   Past Medical History:  Diagnosis Date  . ADHD (attention deficit hyperactivity disorder)   . Bipolar disorder (HCC)   . Depression   . Hyperlipidemia   . Obesity    Social History   Socioeconomic History  . Marital status: Single    Spouse name: Not on file  . Number of children: Not on file  . Years of education: Not on file  . Highest education level: Not on file  Occupational History  . Not on file  Social Needs  . Financial resource strain: Not on file  . Food insecurity:    Worry: Not on file    Inability: Not on file  . Transportation needs:    Medical: Not on file    Non-medical: Not on file  Tobacco Use  . Smoking status: Current Some Day Smoker    Types: E-cigarettes, Cigarettes  . Smokeless tobacco: Never Used  Substance and Sexual Activity  . Alcohol use: Yes    Alcohol/week: 0.0 standard drinks    Comment: occasional   . Drug use: Yes    Frequency: 7.0 times per week    Types: Marijuana  . Sexual activity: Yes    Partners: Female    Birth control/protection: Condom  Lifestyle  . Physical activity:    Days per week: Not on file    Minutes per session: Not on file  . Stress: Not on file  Relationships  .  Social connections:    Talks on phone: Not on file    Gets together: Not on file    Attends religious service: Not on file    Active member of club or organization: Not on file    Attends meetings of clubs or organizations: Not on file    Relationship status: Not on file  . Intimate partner violence:    Fear of current or ex partner: Not on file    Emotionally abused: Not on file    Physically abused: Not on file    Forced sexual activity: Not on file  Other Topics Concern  . Not on file  Social History Narrative  . Not on file   Outpatient Encounter Medications as of 11/21/2018  Medication Sig  . hydrochlorothiazide (HYDRODIURIL) 25 MG tablet Take 1 tablet (25 mg total) by mouth daily.  . [DISCONTINUED] cyclobenzaprine (FLEXERIL) 5 MG tablet Take 1 tablet (5 mg total) by mouth 3 (three) times daily as needed for muscle spasms.  . [DISCONTINUED] predniSONE (DELTASONE) 10 MG tablet Taper daily as follows: 6 pills, 5, 4, 3, 2, 1   No facility-administered encounter medications on file as of 11/21/2018.    No Known Allergies  Review of Systems  Eyes: Positive for photophobia (yesterday). Negative for blurred vision, double vision, pain (itching), discharge and redness.  Neurological: Positive for headaches. Negative for dizziness, tingling and sensory change.    Objective:  BP (!) 142/90 (BP Location: Right Arm, Patient Position: Sitting, Cuff Size: Large)   Pulse 83   Temp 98.3 F (36.8 C) (Oral)   Wt (!) 374 lb (169.6 kg)   SpO2 98%   BMI 48.02 kg/m  Wt Readings from Last 3 Encounters:  11/21/18 (!) 374 lb (169.6 kg)  09/21/18 (!) 365 lb 3.2 oz (165.7 kg)  05/03/18 (!) 370 lb 3.2 oz (167.9 kg)   BP Readings from Last 3 Encounters:  11/21/18 (!) 142/90  09/21/18 (!) 150/110  05/03/18 (!) 172/119    Physical Exam  Constitutional: He is oriented to person, place, and time and well-developed, well-nourished, and in no distress.  HENT:  Head: Normocephalic.  Right Ear:  External ear normal.  Left Ear: External ear normal.  Nose: Nose normal.  Mouth/Throat: Oropharynx is clear and moist.  Eyes: Conjunctivae and EOM are normal.  Neck: Neck supple.  Cardiovascular: Normal rate and regular rhythm.  Pulmonary/Chest: Effort normal and breath sounds normal.  Abdominal: Soft. Bowel sounds are normal. He exhibits no distension. There is no abdominal tenderness.  Musculoskeletal: Normal range of motion.  Lymphadenopathy:    He has no cervical adenopathy.  Neurological: He is alert and oriented to person, place, and time. No cranial nerve deficit. Gait normal. Coordination normal.  Skin: No rash noted.  Psychiatric: Mood, affect and judgment normal.    Assessment and Plan :   1. Migraine without status migrainosus, not intractable, unspecified migraine type Developed pounding bitemporal headache with nausea and photosensitivity yesterday morning similar to past migraines. States he has headaches about every 2-3 months that last one day at most. Has not vomited and it stopped after taking Aleve and staying in a cool dark room until 5:00pm. No neck or scalp tenderness today and able to tolerate lights today. Given note for out of work yesterday and today - may return to work tomorrow (11-22-18). Given a headache diet sheet and may use Aleve again if needed. Check routine labs and follow up prn. May need referral to a neurologist if headaches become more frequent. Last ER visit for migraine was on 03-27-16. - CBC with Differential/Platelet - Comprehensive metabolic panel - TSH  2. Essential hypertension Improvement in BP since he has gotten back on the HCTZ 25 mg qd regularly. Should restrict salt and caffeine consumption along with losing weight. Continue present meds and recheck labs. - CBC with Differential/Platelet - Comprehensive metabolic panel - TSH  3. Hyperlipidemia, mixed Does not take anything for hyperlipidemia. Total cholesterol 1 yr ago was 246 with  triglycerides 173, HDL 34 and LDL 177. Weight up 9 lbs since Feb. 2020. Need to lose weight and get on a low fat diet. Will recheck Lipid Panel and TSH. - Lipid panel - TSH

## 2018-11-22 LAB — CBC WITH DIFFERENTIAL/PLATELET
Basophils Absolute: 0.1 10*3/uL (ref 0.0–0.2)
Basos: 1 %
EOS (ABSOLUTE): 0.1 10*3/uL (ref 0.0–0.4)
Eos: 2 %
Hematocrit: 44.9 % (ref 37.5–51.0)
Hemoglobin: 15.4 g/dL (ref 13.0–17.7)
Immature Grans (Abs): 0.1 10*3/uL (ref 0.0–0.1)
Immature Granulocytes: 1 %
Lymphocytes Absolute: 2.9 10*3/uL (ref 0.7–3.1)
Lymphs: 34 %
MCH: 29.8 pg (ref 26.6–33.0)
MCHC: 34.3 g/dL (ref 31.5–35.7)
MCV: 87 fL (ref 79–97)
Monocytes Absolute: 0.7 10*3/uL (ref 0.1–0.9)
Monocytes: 9 %
Neutrophils Absolute: 4.5 10*3/uL (ref 1.4–7.0)
Neutrophils: 53 %
Platelets: 322 10*3/uL (ref 150–450)
RBC: 5.16 x10E6/uL (ref 4.14–5.80)
RDW: 12.7 % (ref 11.6–15.4)
WBC: 8.4 10*3/uL (ref 3.4–10.8)

## 2018-11-22 LAB — COMPREHENSIVE METABOLIC PANEL
ALT: 49 IU/L — ABNORMAL HIGH (ref 0–44)
AST: 19 IU/L (ref 0–40)
Albumin/Globulin Ratio: 1.7 (ref 1.2–2.2)
Albumin: 4.6 g/dL (ref 4.1–5.2)
Alkaline Phosphatase: 59 IU/L (ref 39–117)
BUN/Creatinine Ratio: 21 — ABNORMAL HIGH (ref 9–20)
BUN: 14 mg/dL (ref 6–20)
Bilirubin Total: 0.2 mg/dL (ref 0.0–1.2)
CO2: 19 mmol/L — ABNORMAL LOW (ref 20–29)
Calcium: 10.1 mg/dL (ref 8.7–10.2)
Chloride: 103 mmol/L (ref 96–106)
Creatinine, Ser: 0.67 mg/dL — ABNORMAL LOW (ref 0.76–1.27)
GFR calc Af Amer: 157 mL/min/{1.73_m2} (ref >59)
GFR calc non Af Amer: 135 mL/min/{1.73_m2} (ref >59)
Globulin, Total: 2.7 g/dL (ref 1.5–4.5)
Glucose: 99 mg/dL (ref 65–99)
Potassium: 4.3 mmol/L (ref 3.5–5.2)
Sodium: 139 mmol/L (ref 134–144)
Total Protein: 7.3 g/dL (ref 6.0–8.5)

## 2018-11-22 LAB — LIPID PANEL
Chol/HDL Ratio: 7.3 ratio — ABNORMAL HIGH (ref 0.0–5.0)
Cholesterol, Total: 240 mg/dL — ABNORMAL HIGH (ref 100–199)
HDL: 33 mg/dL — ABNORMAL LOW (ref >39)
LDL Calculated: 161 mg/dL — ABNORMAL HIGH (ref 0–99)
Triglycerides: 230 mg/dL — ABNORMAL HIGH (ref 0–149)
VLDL Cholesterol Cal: 46 mg/dL — ABNORMAL HIGH (ref 5–40)

## 2018-11-22 LAB — TSH: TSH: 1.55 u[IU]/mL (ref 0.450–4.500)

## 2018-11-27 ENCOUNTER — Other Ambulatory Visit: Payer: Self-pay

## 2018-11-27 DIAGNOSIS — E782 Mixed hyperlipidemia: Secondary | ICD-10-CM

## 2018-11-27 MED ORDER — ATORVASTATIN CALCIUM 20 MG PO TABS: 20.0000 mg | ORAL_TABLET | Freq: Every day | ORAL | 3 refills | Status: DC

## 2018-11-27 NOTE — Progress Notes (Signed)
Atorvastatin

## 2019-01-18 ENCOUNTER — Other Ambulatory Visit: Payer: BLUE CROSS/BLUE SHIELD

## 2019-01-18 ENCOUNTER — Ambulatory Visit (INDEPENDENT_AMBULATORY_CARE_PROVIDER_SITE_OTHER): Payer: BLUE CROSS/BLUE SHIELD | Admitting: Physician Assistant

## 2019-01-18 ENCOUNTER — Encounter: Payer: Self-pay | Admitting: Physician Assistant

## 2019-01-18 ENCOUNTER — Telehealth: Payer: Self-pay | Admitting: *Deleted

## 2019-01-18 DIAGNOSIS — A084 Viral intestinal infection, unspecified: Secondary | ICD-10-CM

## 2019-01-18 DIAGNOSIS — Z20822 Contact with and (suspected) exposure to covid-19: Secondary | ICD-10-CM

## 2019-01-18 MED ORDER — ONDANSETRON HCL 4 MG PO TABS: 4.0000 mg | ORAL_TABLET | Freq: Three times a day (TID) | ORAL | 0 refills | Status: DC | PRN

## 2019-01-18 NOTE — Progress Notes (Signed)
       Patient: Dean Tucker Male    DOB: Aug 25, 1995   23 y.o.   MRN: [id number] Visit Date: 01/18/2019  Today's Provider: Trinna Post, PA-C   Chief Complaint  Patient presents with  . GI Problem   Subjective:    Virtual Visit via Video Note  I connected with Dean Tucker on 01/18/19 at 11:20 AM EDT by a video enabled telemedicine application and verified that I am speaking with the correct person using two identifiers.  Location: Patient: Home  Provider: Office    I discussed the limitations of evaluation and management by telemedicine and the availability of in person appointments. The patient expressed understanding and agreed to proceed.  GI Problem The primary symptoms include vomiting and diarrhea. The illness began yesterday. The onset was sudden. The problem has been gradually improving.  The vomiting began yesterday. Vomiting occurs 2 to 5 times per day.  The diarrhea began yesterday. The diarrhea is watery. The diarrhea occurs 5 to 10 times per day.  The illness is also significant for back pain.   Reports another coworker had similar symptoms. Reports some SOB. Denies fever. Reports cough. Works at [employer].   No Known Allergies   Current Outpatient Medications:  .  atorvastatin (LIPITOR) 20 MG tablet, Take 1 tablet (20 mg total) by mouth daily., Disp: 90 tablet, Rfl: 3 .  hydrochlorothiazide (HYDRODIURIL) 25 MG tablet, Take 1 tablet (25 mg total) by mouth daily., Disp: 90 tablet, Rfl: 0 .  ondansetron (ZOFRAN) 4 MG tablet, Take 1 tablet (4 mg total) by mouth every 8 (eight) hours as needed for nausea or vomiting., Disp: 20 tablet, Rfl: 0  Review of Systems  Constitutional: Negative.   Respiratory: Negative.   Cardiovascular: Negative.   Gastrointestinal: Positive for diarrhea and vomiting.  Musculoskeletal: Positive for back pain.    Social History   Tobacco Use  . Smoking status: Current Some Day Smoker    Types: E-cigarettes, Cigarettes  .  Smokeless tobacco: Never Used  Substance Use Topics  . Alcohol use: Yes    Alcohol/week: 0.0 standard drinks    Comment: occasional       Objective:   There were no vitals taken for this visit. There were no vitals filed for this visit.   Physical Exam Pulmonary:     Effort: No respiratory distress.      No results found for any visits on 01/18/19.     Assessment & Plan    1. Viral gastroenteritis  Will send for COVID testing and treat for viral gastroenteritis.   - ondansetron (ZOFRAN) 4 MG tablet; Take 1 tablet (4 mg total) by mouth every 8 (eight) hours as needed for nausea or vomiting.  Dispense: 20 tablet; Refill: 0  The entirety of the information documented in the History of Present Illness, Review of Systems and Physical Exam were personally obtained by me. Portions of this information were initially documented by Lynford Humphrey, CMA and reviewed by me for thoroughness and accuracy.   F/u PRN.   Trinna Post, PA-C  [facility]

## 2019-01-18 NOTE — Patient Instructions (Signed)
This information is directly available on the CDC website: https://www.cdc.gov/coronavirus/2019-ncov/if-you-are-sick/steps-when-sick.html    Source:CDC Reference to specific commercial products, manufacturers, companies, or trademarks does not constitute its endorsement or recommendation by the U.S. Government, Department of Health and Human Services, or Centers for Disease Control and Prevention.  

## 2019-01-18 NOTE — Telephone Encounter (Signed)
Received communication from Carles Collet, [place] requesting COVID 19 testing for patient.  Called patient to schedule test.  Left voicemail to return call.  COVID 19 test ordered. See letter in communications tab.

## 2019-01-18 NOTE — Telephone Encounter (Signed)
Pt. Called back , scheduled for today.

## 2019-01-21 ENCOUNTER — Encounter: Payer: Self-pay | Admitting: Physician Assistant

## 2019-01-22 LAB — NOVEL CORONAVIRUS, NAA: SARS-CoV-2, NAA: NOT DETECTED

## 2019-02-02 ENCOUNTER — Other Ambulatory Visit: Payer: Self-pay | Admitting: Physician Assistant

## 2019-02-02 DIAGNOSIS — I10 Essential (primary) hypertension: Secondary | ICD-10-CM

## 2019-02-02 NOTE — Telephone Encounter (Signed)
Dean Tucker,  It looks like you are the person he started seeing after Mikki Santee left, not Adriana.

## 2019-08-05 ENCOUNTER — Ambulatory Visit
Admission: EM | Admit: 2019-08-05 | Discharge: 2019-08-05 | Disposition: A | Discharge status: Home / Self Care | Payer: Managed Care, Other (non HMO) | Attending: Family Medicine | Admitting: Family Medicine

## 2019-08-05 ENCOUNTER — Ambulatory Visit (INDEPENDENT_AMBULATORY_CARE_PROVIDER_SITE_OTHER)
Admission: RE | Admit: 2019-08-05 | Discharge: 2019-08-05 | Disposition: A | Discharge status: Home / Self Care | Payer: Managed Care, Other (non HMO) | Source: Ambulatory Visit

## 2019-08-05 ENCOUNTER — Other Ambulatory Visit: Payer: Self-pay

## 2019-08-05 DIAGNOSIS — R197 Diarrhea, unspecified: Secondary | ICD-10-CM | POA: Diagnosis not present

## 2019-08-05 DIAGNOSIS — E782 Mixed hyperlipidemia: Secondary | ICD-10-CM | POA: Diagnosis not present

## 2019-08-05 DIAGNOSIS — Z6841 Body Mass Index (BMI) 40.0 and over, adult: Secondary | ICD-10-CM | POA: Diagnosis not present

## 2019-08-05 DIAGNOSIS — R6883 Chills (without fever): Secondary | ICD-10-CM

## 2019-08-05 DIAGNOSIS — E669 Obesity, unspecified: Secondary | ICD-10-CM | POA: Insufficient documentation

## 2019-08-05 DIAGNOSIS — R0602 Shortness of breath: Secondary | ICD-10-CM | POA: Diagnosis not present

## 2019-08-05 DIAGNOSIS — Z79899 Other long term (current) drug therapy: Secondary | ICD-10-CM | POA: Diagnosis not present

## 2019-08-05 DIAGNOSIS — Z8249 Family history of ischemic heart disease and other diseases of the circulatory system: Secondary | ICD-10-CM | POA: Diagnosis not present

## 2019-08-05 DIAGNOSIS — F319 Bipolar disorder, unspecified: Secondary | ICD-10-CM | POA: Diagnosis not present

## 2019-08-05 DIAGNOSIS — Z20822 Contact with and (suspected) exposure to covid-19: Secondary | ICD-10-CM

## 2019-08-05 DIAGNOSIS — B349 Viral infection, unspecified: Secondary | ICD-10-CM

## 2019-08-05 DIAGNOSIS — I10 Essential (primary) hypertension: Secondary | ICD-10-CM | POA: Diagnosis not present

## 2019-08-05 DIAGNOSIS — Z833 Family history of diabetes mellitus: Secondary | ICD-10-CM | POA: Insufficient documentation

## 2019-08-05 DIAGNOSIS — Z98818 Other dental procedure status: Secondary | ICD-10-CM | POA: Insufficient documentation

## 2019-08-05 DIAGNOSIS — R05 Cough: Secondary | ICD-10-CM | POA: Diagnosis not present

## 2019-08-05 DIAGNOSIS — F17210 Nicotine dependence, cigarettes, uncomplicated: Secondary | ICD-10-CM | POA: Insufficient documentation

## 2019-08-05 HISTORY — DX: Essential (primary) hypertension: I10

## 2019-08-05 MED ORDER — BENZONATATE 200 MG PO CAPS: 200.0000 mg | ORAL_CAPSULE | Freq: Three times a day (TID) | ORAL | 0 refills | Status: AC | PRN

## 2019-08-05 NOTE — ED Triage Notes (Addendum)
Symptoms started on Wednesday. No direct known COVID exposure. Cough, diarrhea and difficulty taking deep breath. No fever. Pt states he was prescribed HTN and Cholesterol meds in May but only took for 2 weeks because he wanted to smoke marijuana and didn't want the interaction between them

## 2019-08-05 NOTE — ED Provider Notes (Addendum)
Virtual Visit via Video Note:  LASHAN GLUTH  initiated request for Telemedicine visit with [facility] team. I connected with Brynda Peon  on 08/05/2019 at 10:45 AM  for a synchronized telemedicine visit using a video enabled HIPPA compliant telemedicine application. I verified that I am speaking with Brynda Peon  using two identifiers. Lafayette Dunlevy C Celsa Nordahl, PA-C  was physically located in a [facility] Urgent care site and AMOR PACKARD was located at a different location.   The limitations of evaluation and management by telemedicine as well as the availability of in-person appointments were discussed. Patient was informed that he  may incur a bill ( including co-pay) for this virtual visit encounter. EUGEN JEANSONNE  expressed understanding and gave verbal consent to proceed with virtual visit.     History of Present Illness:Dean Tucker  is a 24 y.o. male presents for evaluation of 2 to 3 days of throat irritation, diarrhea as well as slight shortness of breath.  He has had occasional chills, but no known fevers.  His and is currently in quarantine from Covid.  He has been drinking fluids and orange juice, no over-the-counter medicines.  Denies chest pain.   No Known Allergies   Past Medical History:  Diagnosis Date  . ADHD (attention deficit hyperactivity disorder)   . Bipolar disorder (HCC)   . Depression   . Hyperlipidemia   . Obesity      Social History   Tobacco Use  . Smoking status: Current Some Day Smoker    Types: E-cigarettes, Cigarettes  . Smokeless tobacco: Never Used  Substance Use Topics  . Alcohol use: Yes    Alcohol/week: 0.0 standard drinks    Comment: occasional   . Drug use: Yes    Frequency: 7.0 times per week    Types: Marijuana        Observations/Objective: Physical Exam  Constitutional: He is oriented to person, place, and time and well-developed, well-nourished, and in no distress. No distress.  HENT:  Head:  Normocephalic and atraumatic.  Pulmonary/Chest: Effort normal. No respiratory distress.  Speaking full sentences  Musculoskeletal:     Cervical back: Normal range of motion.  Neurological: He is alert and oriented to person, place, and time.  Speech clear, face symmetric     Assessment and Plan:    ICD-10-CM   1. Viral illness  B34.9   2. Encounter for screening laboratory testing for COVID-19 virus  Z20.822        Follow Up Instructions:  Mild URI symptoms, GI upset.  Does have Covid exposure.  Likely viral etiology.  Recommended Covid testing, discussed options of nurse visit at [facility] today versus [place] drive-through testing appointment tomorrow.  Recommended symptomatic and supportive care, provide Tessalon as needed for cough.  Continue to monitor breathing, follow-up in person for further evaluation if symptoms progressing or worsening.  Discussed strict return precautions. Patient verbalized understanding and is agreeable with plan.    I discussed the assessment and treatment plan with the patient. The patient was provided an opportunity to ask questions and all were answered. The patient agreed with the plan and demonstrated an understanding of the instructions.   The patient was advised to call back or seek an in-person evaluation if the symptoms worsen or if the condition fails to improve as anticipated.      Junius Creamer Vanya Carberry, PA-C  08/05/2019 10:45 AM         Kai Calico, Junius Creamer,  PA-C 08/05/19 1046    Hanzel Pizzo, [place] C, PA-C 08/05/19 1142

## 2019-08-05 NOTE — ED Provider Notes (Signed)
[facility]    CSN: [id number] Arrival date & time: 08/05/19  1135      History   Chief Complaint Chief Complaint  Patient presents with  . Cough  . Diarrhea  . Shortness of Breath    HPI Dean Tucker is a 24 y.o. male.   24 yo male with a c/o cough, congestion and watery diarrhea for the past 5 days. Denies any nausea, vomiting, melena, hematochezia, abdominal pain, chest pain. Has slight shortness of breath sensation.  Has been able to keep fluids down. Had a telemedicine visit today and cough medication called in; recommend that he come get tested.   Patient states he has blood pressure medication at home but not taking due to wanting to smoke.    Cough Associated symptoms: shortness of breath   Diarrhea Shortness of Breath Associated symptoms: cough     Past Medical History:  Diagnosis Date  . ADHD (attention deficit hyperactivity disorder)   . Bipolar disorder (HCC)   . Depression   . Hyperlipidemia   . Hypertension   . Obesity     Patient Active Problem List   Diagnosis Date Noted  . Essential hypertension 05/03/2018  . ADD (attention deficit disorder) 06/18/2015  . H/O manic depressive disorder 06/18/2015  . Hyperlipidemia, mixed 06/18/2015  . Obesity 06/18/2015    Past Surgical History:  Procedure Laterality Date  . ADENOIDECTOMY  10/1996  . WISDOM TOOTH EXTRACTION         Home Medications    Prior to Admission medications   Medication Sig Start Date End Date Taking? Authorizing Provider  atorvastatin (LIPITOR) 20 MG tablet Take 1 tablet (20 mg total) by mouth daily. 11/27/18   Chrismon, Jodell Cipro, PA  benzonatate (TESSALON) 200 MG capsule Take 1 capsule (200 mg total) by mouth 3 (three) times daily as needed for up to 7 days for cough. 08/05/19 08/12/19  Wieters, Hallie C, PA-C  hydrochlorothiazide (HYDRODIURIL) 25 MG tablet TAKE 1 TABLET BY MOUTH EVERY DAY 02/02/19   Chrismon, Jodell Cipro, PA  ondansetron (ZOFRAN) 4 MG tablet Take 1  tablet (4 mg total) by mouth every 8 (eight) hours as needed for nausea or vomiting. 01/18/19   Trey Sailors, PA-C    Family History Family History  Problem Relation Age of Onset  . Healthy Mother   . Healthy Sister   . Healthy Brother   . Healthy Daughter   . Hypertension Maternal Grandmother   . Hypertension Maternal Grandfather   . Diabetes Maternal Grandfather        pre-diabetic  . Healthy Brother   . Healthy Brother   . Healthy Brother   . Healthy Brother   . Healthy Father     Social History Social History   Tobacco Use  . Smoking status: Current Some Day Smoker    Types: E-cigarettes, Cigarettes  . Smokeless tobacco: Never Used  Substance Use Topics  . Alcohol use: Yes    Alcohol/week: 0.0 standard drinks    Comment: occasional   . Drug use: Yes    Types: Marijuana    Comment: last use yesterday     Allergies   Patient has no known allergies.   Review of Systems Review of Systems  Respiratory: Positive for cough and shortness of breath.   Gastrointestinal: Positive for diarrhea.     Physical Exam Triage Vital Signs ED Triage Vitals  Enc Vitals Group     BP 08/05/19 1147 (!) 189/129  Pulse Rate 08/05/19 1147 90     Resp 08/05/19 1147 20     Temp 08/05/19 1147 97.9 F (36.6 C)     Temp Source 08/05/19 1147 Oral     SpO2 08/05/19 1147 100 %     Weight 08/05/19 1150 (!) 407 lb (184.6 kg)     Height 08/05/19 1150 6\' 2"  (1.88 m)     Head Circumference --      Peak Flow --      Pain Score 08/05/19 1150 0     Pain Loc --      Pain Edu? --      Excl. in [place]? --    No data found.  Updated Vital Signs BP (!) 189/129 (BP Location: Left Arm) Comment: Pt not taking his blood pressure medication  Pulse 90   Temp 97.9 F (36.6 C) (Oral)   Resp 20   Ht 6\' 2"  (1.88 m)   Wt (!) 184.6 kg   SpO2 100%   BMI 52.26 kg/m   Visual Acuity Right Eye Distance:   Left Eye Distance:   Bilateral Distance:    Right Eye Near:   Left Eye Near:      Bilateral Near:     Physical Exam Vitals and nursing note reviewed.  Constitutional:      General: He is not in acute distress.    Appearance: Normal appearance. He is not toxic-appearing or diaphoretic.  Cardiovascular:     Rate and Rhythm: Normal rate.     Heart sounds: Normal heart sounds.  Pulmonary:     Effort: Pulmonary effort is normal. No respiratory distress.     Breath sounds: Normal breath sounds.  Abdominal:     General: There is no distension.     Palpations: Abdomen is soft.  Neurological:     Mental Status: He is alert.      UC Treatments / Results  Labs (all labs ordered are listed, but only abnormal results are displayed) Labs Reviewed  NOVEL CORONAVIRUS, NAA (HOSP ORDER, SEND-OUT TO REF LAB; TAT 18-24 HRS)    EKG   Radiology No results found.  Procedures Procedures (including critical care time)  Medications Ordered in UC Medications - No data to display  Initial Impression / Assessment and Plan / UC Course  I have reviewed the triage vital signs and the nursing notes.  Pertinent labs & imaging results that were available during my care of the patient were reviewed by me and considered in my medical decision making (see chart for details).      Final Clinical Impressions(s) / UC Diagnoses   Final diagnoses:  Viral syndrome  Diarrhea, unspecified type     Discharge Instructions     Rest, fluids, over the counter medications, Imodium AD as needed Await covid test    ED Prescriptions    None     1. diagnosis reviewed with patient 2. Recommend supportive treatment as above 3. Await covid test 4. Follow-up prn if symptoms worsen or don't improve PDMP not reviewed this encounter.   Norval Gable, MD 08/05/19 1243

## 2019-08-05 NOTE — Discharge Instructions (Signed)
Please go to [facility] urgent care for nurse visit and Covid testing Please quarantine until Covid results return Rest and push fluids May use Tessalon as needed for cough Please follow-up in person if developing increased difficulty breathing or shortness of breath, chest pain.

## 2019-08-05 NOTE — Discharge Instructions (Addendum)
Rest, fluids, over the counter medications, Imodium AD as needed Await covid test

## 2019-08-06 LAB — NOVEL CORONAVIRUS, NAA (HOSP ORDER, SEND-OUT TO REF LAB; TAT 18-24 HRS): SARS-CoV-2, NAA: NOT DETECTED

## 2019-08-19 ENCOUNTER — Other Ambulatory Visit: Payer: Self-pay

## 2019-08-19 ENCOUNTER — Ambulatory Visit
Admission: EM | Admit: 2019-08-19 | Discharge: 2019-08-19 | Disposition: A | Discharge status: Home / Self Care | Payer: No Typology Code available for payment source | Attending: Family Medicine | Admitting: Family Medicine

## 2019-08-19 ENCOUNTER — Encounter: Payer: Self-pay | Admitting: Emergency Medicine

## 2019-08-19 DIAGNOSIS — U071 COVID-19: Secondary | ICD-10-CM | POA: Diagnosis not present

## 2019-08-19 DIAGNOSIS — Z20822 Contact with and (suspected) exposure to covid-19: Secondary | ICD-10-CM | POA: Diagnosis not present

## 2019-08-19 DIAGNOSIS — I10 Essential (primary) hypertension: Secondary | ICD-10-CM | POA: Insufficient documentation

## 2019-08-19 DIAGNOSIS — Z7189 Other specified counseling: Secondary | ICD-10-CM | POA: Insufficient documentation

## 2019-08-19 DIAGNOSIS — K219 Gastro-esophageal reflux disease without esophagitis: Secondary | ICD-10-CM | POA: Insufficient documentation

## 2019-08-19 MED ORDER — ALUM & MAG HYDROXIDE-SIMETH 200-200-20 MG/5ML PO SUSP
30.0000 mL | Freq: Once | ORAL | Status: AC
  Administered 2019-08-19: 30 mL via ORAL

## 2019-08-19 MED ORDER — OMEPRAZOLE 20 MG PO CPDR: 20.0000 mg | DELAYED_RELEASE_CAPSULE | Freq: Every day | ORAL | 0 refills | Status: DC

## 2019-08-19 MED ORDER — LIDOCAINE VISCOUS HCL 2 % MT SOLN
15.0000 mL | Freq: Once | OROMUCOSAL | Status: AC
  Administered 2019-08-19: 15 mL via ORAL

## 2019-08-19 NOTE — ED Triage Notes (Signed)
Patient c/o HA loss of taste and smell and runny nose since yesterday.  Patient also reports abdominal pain that started yesterday.  Patient denies fevers.

## 2019-08-19 NOTE — Discharge Instructions (Signed)
It was very nice seeing you today in clinic. Thank you for entrusting me with your care.   Rest and increase fluid intake. Take medication as prescribed. PLEASE make sure you are taking your blood pressure medication.  You were tested for SARS-CoV-2 (novel coronavirus) today. Testing is performed by an outside lab ([facility]) and has variable turn around times ranging between 24-48 hours. Current recommendations from the the CDC and [place] DHHS require that you remain out of work in order to quarantine at home until negative test results are have been received. In the event that your test results are positive, you will be contacted with further directives. These measures are being implemented out of an abundance of caution to prevent transmission and spread during the current SARS-CoV-2 pandemic.  Make arrangements to follow up with your regular doctor in 1 week for re-evaluation if not improving. You also need to discuss your blood pressure and further intervention if not improving while you are back on your medications. If your symptoms/condition worsens, please seek follow up care either here or in the ER. Please remember, our [facility] providers are "right here with you" when you need [place].   Again, it was my pleasure to take care of you today. Thank you for choosing our clinic. I hope that you start to feel better quickly.   Quentin Mulling, MSN, APRN, FNP-C, CEN Advanced Practice Provider [facility]

## 2019-08-20 NOTE — ED Provider Notes (Signed)
[place], [place]   Name: Dean Tucker DOB: Jun 04, 1996 MRN: [id number] CSN: [id number] PCP: Trey Sailors, PA-C  Arrival date and time:  08/19/19 1125  Chief Complaint:  Headache, Nasal Congestion, and Abdominal Pain   NOTE: Prior to seeing the patient today, I have reviewed the triage nursing documentation and vital signs. Clinical staff has updated patient's PMH/PSHx, current medication list, and drug allergies/intolerances to ensure comprehensive history available to assist in medical decision making.   History:   HPI: Dean Tucker is a 24 y.o. male who presents today with complaints of fatigue, congestion, rhinorrhea, and a generalized headache that started with acute onset. Patient denies fevers. He has a mild cough with minor associated shortness of breath. Patient denies wheezing. He denies that he has experienced any nausea, vomiting, or diarrhea. Patient has had ongoing issues with undiagnosed reflex; is not on PPI/GAB. Patient complains of epigastric pain, with associated reflux, that started yesterday. He is eating and drinking well. Patient confirms perceived alterations to his sense of taste or smell; anosmia and ageusia. Patient denies being in close contact with anyone known to be ill; no one else is his home has experienced a similar symptom constellation. He has been tested for SARS-CoV-2 (novel coronavirus) in the past 14 days; last tested negative on 08/05/19 per his report. Patient has not been vaccinated for influenza this season. In efforts to conservatively manage his symptoms at home, the patient notes that he has used IBU, which has not helped to improve his symptoms.    Additionally, patient presents to the urgent care today HYPERtensive at 182/116. Last blood pressure measured in the clinic was also elevated. Patient reports that he has been off of his blood pressure medication; just restarted HCTZ 25 mg on 08/05/19. Dose has not been taken today. Patient denies  any associated chest pain, shortness of breath, palpitations, weakness, AMS, or visual changes.   Past Medical History:  Diagnosis Date  . ADHD (attention deficit hyperactivity disorder)   . Bipolar disorder (HCC)   . Depression   . Hyperlipidemia   . Hypertension   . Obesity     Past Surgical History:  Procedure Laterality Date  . ADENOIDECTOMY  10/1996  . WISDOM TOOTH EXTRACTION      Family History  Problem Relation Age of Onset  . Healthy Mother   . Healthy Sister   . Healthy Brother   . Healthy Daughter   . Hypertension Maternal Grandmother   . Hypertension Maternal Grandfather   . Diabetes Maternal Grandfather        pre-diabetic  . Healthy Brother   . Healthy Brother   . Healthy Brother   . Healthy Brother   . Healthy Father     Social History   Tobacco Use  . Smoking status: Current Some Day Smoker    Types: E-cigarettes, Cigarettes  . Smokeless tobacco: Never Used  Substance Use Topics  . Alcohol use: Yes    Alcohol/week: 0.0 standard drinks    Comment: occasional   . Drug use: Yes    Types: Marijuana    Comment: last use yesterday    Patient Active Problem List   Diagnosis Date Noted  . Essential hypertension 05/03/2018  . ADD (attention deficit disorder) 06/18/2015  . H/O manic depressive disorder 06/18/2015  . Hyperlipidemia, mixed 06/18/2015  . Obesity 06/18/2015    Home Medications:    Current Meds  Medication Sig  . atorvastatin (LIPITOR) 20 MG tablet Take 1 tablet (20  mg total) by mouth daily.  . hydrochlorothiazide (HYDRODIURIL) 25 MG tablet TAKE 1 TABLET BY MOUTH EVERY DAY    Allergies:   Patient has no known allergies.  Review of Systems (ROS): Review of Systems  Constitutional: Positive for fatigue. Negative for fever.  HENT: Positive for congestion (minor ). Negative for ear pain, postnasal drip, rhinorrhea, sinus pressure, sinus pain, sneezing and sore throat.        (+) anosmia and ageusia  Eyes: Negative for pain,  discharge and redness.  Respiratory: Positive for cough (minor) and shortness of breath (minor). Negative for chest tightness.   Cardiovascular: Negative for chest pain and palpitations.       PMH (+) HTN  Gastrointestinal: Positive for abdominal pain. Negative for diarrhea, nausea and vomiting.  Musculoskeletal: Negative for arthralgias, back pain, myalgias and neck pain.  Skin: Negative for color change, pallor and rash.  Neurological: Positive for headaches. Negative for dizziness, syncope and weakness.  Hematological: Negative for adenopathy.     Vital Signs: Today's Vitals   08/19/19 1141 08/19/19 1145 08/19/19 1244  BP:  (!) 182/116   Pulse:  90   Resp:  16   Temp:  98.7 F (37.1 C)   TempSrc:  Oral   SpO2:  99%   Weight: (!) 406 lb 15.5 oz (184.6 kg)    Height: 6\' 2"  (1.88 m)    PainSc: 0-No pain  0-No pain    Physical Exam: Physical Exam  Constitutional: He is oriented to person, place, and time and well-developed, well-nourished, and in no distress.  HENT:  Head: Normocephalic and atraumatic.  Eyes: Pupils are equal, round, and reactive to light.  Neck: No tracheal deviation present.  Cardiovascular: Normal rate, regular rhythm, normal heart sounds and intact distal pulses.  BP chronically elevated. Not entirely compliant with thiazide diuretic therapy.   Pulmonary/Chest: Effort normal and breath sounds normal.  Mild cough noted in clinic. No SOB or increased WOB. No distress. Able to speak in complete sentences without difficulties. SPO2 99% on RA.  Abdominal: Soft. Normal appearance and bowel sounds are normal. He exhibits no distension. There is no CVA tenderness.  Abdomen obese/globular with no focal tenderness, guarding, or rebound.   Musculoskeletal:     Cervical back: Normal range of motion and neck supple.  Lymphadenopathy:    He has no cervical adenopathy.  Neurological: He is alert and oriented to person, place, and time. Gait normal.  Skin: Skin is warm  and dry. No rash noted. He is not diaphoretic.  Psychiatric: Mood, memory, affect and judgment normal.  Nursing note and vitals reviewed.   Urgent Care Treatments / Results:   Orders Placed This Encounter  Procedures  . Novel Coronavirus, NAA (Hosp order, Send-out to Ref Lab; TAT 18-24 hrs    LABS: PLEASE NOTE: all labs that were ordered this encounter are listed, however only abnormal results are displayed. NOVEL CORONAVIRUS, NAA (HOSP ORDER, SEND-OUT TO REF LAB; TAT 18-24 HRS)   EKG: -None  RADIOLOGY: No results found.  PROCEDURES: Procedures  MEDICATIONS RECEIVED THIS VISIT: Medications  alum & mag hydroxide-simeth (MAALOX/MYLANTA) 200-200-20 MG/5ML suspension 30 mL (30 mLs Oral Given 08/19/19 1212)    And  lidocaine (XYLOCAINE) 2 % viscous mouth solution 15 mL (15 mLs Oral Given 08/19/19 1212)    PERTINENT CLINICAL COURSE NOTES/UPDATES:   Initial Impression / Assessment and Plan / Urgent Care Course:  Pertinent labs & imaging results that were available during my care of the patient were  personally reviewed by me and considered in my medical decision making (see lab/imaging section of note for values and interpretations).  RIYAD KEENA is a 24 y.o. male who presents to [facility] today with complaints of Headache, Nasal Congestion, and Abdominal Pain   Patient overall well appearing and in no acute distress today in clinic. Presenting symptoms (see HPI) and exam as documented above. Patient with multiple issues today. Will address as following:   Presents with symptoms associated with SARS-CoV-2 (novel coronavirus). Discussed typical symptom constellation. Reviewed potential for infection and need for testing. Patient amenable to being tested. SARS-CoV-2 swab collected by certified clinical staff. Discussed variable turn around times associated with testing, as swabs are being processed at [facility], and have been taking between 24-48 hours to come back. He was  advised to self quarantine, per [facility] DHHS guidelines, until negative results received. These measures are being implemented out of an abundance of caution to prevent transmission and spread during the current SARS-CoV-2 pandemic.   SARS-CoV-2 highly suspected given current symptom constellation. I discussed with him that his symptoms are felt to be viral in nature, thus antibiotics would not offer him any relief or improve his symptoms any faster than conservative symptomatic management. Discussed supportive care measures at home during acute phase of illness. Patient to rest as much as possible. He was encouraged to ensure adequate hydration (water and low sodium ORS) to prevent dehydration and electrolyte derangements. Patient may use APAP and/or IBU on an as needed basis for pain/fever.  Patient has multiple co-morbidities that increase his SARS-CoV-2 morbidity and mortality risk including HTN, pre-diabetes, HLD, and  Body mass index is 52.25 kg/m. If patient found to be positive for SARS-CoV-2, his co-morbidities should qualify him for treatment with the monoclonal antibody (bamlanivimab) infusion. Outpatient COVID response team to determine eligibility and contact the patient to discuss if she is deemed to be eligible for the infusion treatment.   Regarding epigastric pain. Patient endorses chronic issues with untreated reflux. He has never been on a PPI or been seen by gastroenterology. GI cocktail with lidocaine given in clinic today, which nearly resolved patient's reported epigastric pain. Will start on low dose PPI (omeprazole 20 mg) daily. Patient to follow up with PCP for ongoing evaluation. If not improving, referral to gastroenterology would be beneficial for management input and consideration of BSS +/- EGD.    Blood pressure chronically elevated. Patient was seen here on 08/05/19 and pressure was high at that time. He was found to not be taking his prescribed thiazide diuretic. He was  advised to restart at previously prescribed dose and follow up with PCP for ongoing management. Pressures remain elevated with no associated symptoms. Patient has not taken thiazide diuretic dose at time of clinic visit. Written information provided on HTN management, including low sodium diet. Patient encouraged to contact PCP's office to discuss RTC visit.   Current clinical condition warrants patient being out of work in order to quarantine while waiting for testing results. He was provided with the appropriate documentation to provide to his place of employment that will allow for him to RTW on 08/21/2019 with no restrictions. RTW is contingent on his SARS-CoV-2 test results being reviewed as negative.     Discussed follow up with primary care physician in 1 week for re-evaluation. I have reviewed the follow up and strict return precautions for any new or worsening symptoms. Patient is aware of symptoms that would be deemed urgent/emergent, and would thus require  further evaluation either here or in the emergency department. At the time of discharge, he verbalized understanding and consent with the discharge plan as it was reviewed with him. All questions were fielded by provider and/or clinic staff prior to patient discharge.    Final Clinical Impressions / Urgent Care Diagnoses:   Final diagnoses:  Suspected COVID-19 virus infection  Gastroesophageal reflux disease, unspecified whether esophagitis present  Hypertension, unspecified type  Encounter for laboratory testing for COVID-19 virus  Advice given about COVID-19 virus infection    New Prescriptions:  [place] Controlled Substance Registry consulted? Not Applicable  Meds ordered this encounter  Medications  . AND Linked Order Group   . alum & mag hydroxide-simeth (MAALOX/MYLANTA) 200-200-20 MG/5ML suspension 30 mL   . lidocaine (XYLOCAINE) 2 % viscous mouth solution 15 mL  . omeprazole (PRILOSEC) 20 MG capsule    Sig: Take 1 capsule (20  mg total) by mouth daily.    Dispense:  30 capsule    Refill:  0    Recommended Follow up Care:  Patient encouraged to follow up with the following provider within the specified time frame, or sooner as dictated by the severity of his symptoms. As always, he was instructed that for any urgent/emergent care needs, he should seek care either here or in the emergency department for more immediate evaluation.  Follow-up Information    Trey Sailors, PA-C In 1 week.   Specialty: Physician Assistant Why: General reassessment of symptoms if not improving Contact information: [address] [place] [address] [phone]         NOTE: This note was prepared using Dragon dictation software along with smaller phrase technology. Despite my best ability to proofread, there is the potential that transcriptional errors may still occur from this process, and are completely unintentional.     Verlee Monte, NP 08/20/19 1455

## 2019-08-21 ENCOUNTER — Telehealth (HOSPITAL_COMMUNITY): Payer: Self-pay | Admitting: Emergency Medicine

## 2019-08-21 ENCOUNTER — Telehealth: Payer: Self-pay | Admitting: Nurse Practitioner

## 2019-08-21 LAB — NOVEL CORONAVIRUS, NAA (HOSP ORDER, SEND-OUT TO REF LAB; TAT 18-24 HRS): SARS-CoV-2, NAA: DETECTED — AB

## 2019-08-21 NOTE — Telephone Encounter (Signed)
Called to Discuss with patient about Covid symptoms and the use of bamlanivimab, a monoclonal antibody infusion for those with mild to moderate Covid symptoms and at a high risk of hospitalization.     Symptoms started on 08/05/19

## 2019-08-21 NOTE — Telephone Encounter (Signed)
Your test for COVID-19 was positive, meaning that you were infected with the novel coronavirus and could give the germ to others.  Please continue isolation at home for at least 10 days since the start of your symptoms. If you do not have symptoms, please isolate at home for 10 days from the day you were tested. Once you complete your 10 day quarantine, you may return to normal activities as long as you've not had a fever for over 24 hours(without taking fever reducing medicine) and your symptoms are improving. Please continue good preventive care measures, including:  frequent hand-washing, avoid touching your face, cover coughs/sneezes, stay out of crowds and keep a 6 foot distance from others.  Go to the nearest hospital emergency room if fever/cough/breathlessness are severe or illness seems like a threat to life.  Patient contacted by phone and made aware of    results. Pt verbalized understanding and had all questions answered.  Quarantine ends Feb 3rd.

## 2019-09-04 ENCOUNTER — Encounter: Payer: Self-pay | Admitting: Adult Health

## 2019-09-04 ENCOUNTER — Other Ambulatory Visit: Payer: Self-pay

## 2019-09-04 ENCOUNTER — Ambulatory Visit (INDEPENDENT_AMBULATORY_CARE_PROVIDER_SITE_OTHER): Payer: Managed Care, Other (non HMO) | Admitting: Adult Health

## 2019-09-04 ENCOUNTER — Ambulatory Visit: Payer: Self-pay

## 2019-09-04 VITALS — BP 156/100 | HR 80 | Temp 97.5°F | Resp 16 | Wt >= 6400 oz

## 2019-09-04 DIAGNOSIS — Z8043 Family history of malignant neoplasm of testis: Secondary | ICD-10-CM | POA: Insufficient documentation

## 2019-09-04 DIAGNOSIS — N50812 Left testicular pain: Secondary | ICD-10-CM

## 2019-09-04 DIAGNOSIS — I10 Essential (primary) hypertension: Secondary | ICD-10-CM

## 2019-09-04 DIAGNOSIS — N5089 Other specified disorders of the male genital organs: Secondary | ICD-10-CM | POA: Diagnosis not present

## 2019-09-04 LAB — POCT URINALYSIS DIPSTICK
Bilirubin, UA: NEGATIVE
Blood, UA: NEGATIVE
Glucose, UA: NEGATIVE
Ketones, UA: NEGATIVE
Leukocytes, UA: NEGATIVE
Nitrite, UA: NEGATIVE
Protein, UA: NEGATIVE
Spec Grav, UA: 1.015 (ref 1.010–1.025)
Urobilinogen, UA: 0.2 E.U./dL
pH, UA: 7 (ref 5.0–8.0)

## 2019-09-04 NOTE — Patient Instructions (Signed)
Testicular Self-Exam A self-exam of your testicles (testicular self-exam) is looking at and feeling your testicles for unusual lumps or swelling. Swelling, lumps, or pain can be caused by:  Injuries.  Puffiness, redness, and soreness (inflammation).  Infection.  Extra fluids around your testicle (hydrocele).  Twisted testicles (testicular torsion).  Cancer of the testicle (testicular cancer). Why is it important to do a self-exam of testicles? You may need to do self-exams if you are at risk for cancer of the testicles. You may be at risk if you have:  A testicle that has not descended (cryptorchidism).  A history of cancer of the testicle.  A family history of cancer of the testicle. How to do a self-exam of testicles It is easiest to do a self-exam after a warm bath or shower. Testicles are harder to examine when you are cold. A normal testicle is egg-shaped and feels firm. It is smooth, and it is not tender. At the back of your testicles, there is a firm cord that feels like spaghetti (spermatic cord). Look and feel for changes  Stand and hold your penis away from your body.  Look at each testicle to check for lumps or swelling.  Roll each testicle between your thumb and finger. Feel the whole testicle. Feel for: ? Lumps. ? Swelling. ? Discomfort.  Check for swelling or tender bumps in the groin area. Your groin is where your lower belly (abdomen) meets your upper thighs. Contact a health care provider if:  You find a bump or lump. This may be like a small, hard bump that is the size of a pea.  You find swelling.  You find pain.  You find soreness.  You see or feel any other changes. Summary  A self-exam of your testicles is looking at and feeling your testicles for lumps or swelling.  You may need to do self-exams if you are at risk for cancer of the testicle.  You should check each of your testicles for lumps, swelling, or discomfort.  You should check for  swelling or tender bumps in the groin area. Your groin is where your lower belly (abdomen) meets your upper thighs. This information is not intended to replace advice given to you by your health care provider. Make sure you discuss any questions you have with your health care provider. Document Revised: 11/02/2018 Document Reviewed: 06/07/2016 Elsevier Patient Education  [place]  A spermatocele is a fluid-filled sac (cyst) inside the scrotum. This type of cyst often forms at the top of the testicle where sperm is stored (epididymis). The cyst sometimes forms along the tube that carries sperm away from the epididymis (vas deferens). Spermatoceles are usually painless. Most cysts are small, but they can grow larger. Spermatoceles are not cancerous (are benign). What are the causes? The cause of this condition is not known. What are the signs or symptoms? In most cases, small cysts do not cause symptoms. If you do have symptoms, they may include:  Dull pain.  A feeling of heaviness.  An enlargement of your scrotum, if the cyst is large. How is this diagnosed? This condition is diagnosed based on a physical exam. You or your health care provider may notice the cyst when feeling your scrotum. Your provider may shine a light through (transilluminate) your scrotum to see if light will pass through the cyst. You may also have an ultrasound of your scrotum to rule out a tumor. How is this treated? Small spermatoceles do not need  to be treated. If the spermatocele has grown large or is uncomfortable, surgery to remove the cyst may be recommended. Follow these instructions at home:  Watch your spermatocele for any changes.  Keep all follow-up visits as told by your health care provider. This is important. Contact a health care provider if:  Your spermatocele gets larger.  You have pain in your scrotum.  Your spermatocele comes back after treatment. This information is  not intended to replace advice given to you by your health care provider. Make sure you discuss any questions you have with your health care provider. Document Revised: 06/24/2017 Document Reviewed: 07/27/2015 Elsevier Patient Education  2020 [employer].

## 2019-09-04 NOTE — Progress Notes (Signed)
Patient: Dean Tucker Male    DOB: [birth year]   24 y.o.   MRN: [id number] Visit Date: 09/04/2019  Today's Provider: Marcille Buffy, FNP   Chief Complaint  Patient presents with  . Groin Swelling   Subjective:     Testicle Pain The patient's primary symptoms include scrotal swelling and testicular pain. This is a new problem. The current episode started in the past 7 days. The problem occurs constantly. The problem has been gradually worsening. The pain is moderate. Pertinent negatives include no abdominal pain, chest pain, chills, constipation, coughing, diarrhea, discolored urine, dysuria, fever, flank pain, frequency, headaches, hematuria, nausea, shortness of breath, urgency, urinary retention or vomiting. The testicular pain affects the right testicle. There is swelling in the right testicle. The color of the testicles is normal. The symptoms are aggravated by tactile pressure. He has tried nothing for the symptoms.   Right testicle noticed " lump" five days ago, he has pain with it rated 4-5/10.Pain started on 3 days ago. He notices the pain with sitting and standing but worse with standing. He has not tried any medications. He notices the pain the most when he is touching the area to examine.  Denies any rectal pain or pressure.  Denies any urinary symptoms.   Denies any injury or trauma.   Brother with testicular cancer in at 13 years age.   He has been treated for epididymitis in past. Resolved.   Denies any new partners, denies any STD's. Denies any testing. Denies penile drainage or sores.  Patient  denies any fever, body aches,chills, rash, chest pain, shortness of breath, nausea, vomiting, or diarrhea.  He has no  He takes HCTZ for 25 mg daily, blood pressure elevated 156/100  he is in pain today. Denies any other associated symptoms. He reports he does not remember his medication and has missed many doses.   08/19/19 Covid positive. He reports he did  well and has had no concerns. Denies any cough. He still does not have his smell or much taste back.   Patient  denies any fever, body aches,chills, palpations,visual changes,  rash, chest pain, shortness of breath, nausea, vomiting, or diarrhea.   No Known Allergies   Current Outpatient Medications:  .  hydrochlorothiazide (HYDRODIURIL) 25 MG tablet, TAKE 1 TABLET BY MOUTH EVERY DAY, Disp: 90 tablet, Rfl: 3  Review of Systems  Constitutional: Negative for chills and fever.  Respiratory: Negative for cough, shortness of breath and wheezing.   Cardiovascular: Negative for chest pain and palpitations.  Gastrointestinal: Negative for abdominal pain, constipation, diarrhea, nausea, rectal pain and vomiting.  Genitourinary: Positive for scrotal swelling and testicular pain. Negative for dysuria, flank pain, frequency and urgency.  Neurological: Negative for headaches.    Social History   Tobacco Use  . Smoking status: Current Some Day Smoker    Types: E-cigarettes, Cigarettes  . Smokeless tobacco: Never Used  Substance Use Topics  . Alcohol use: Yes    Alcohol/week: 0.0 standard drinks    Comment: occasional       Objective:   BP (!) 156/100 (BP Location: Left Arm, Patient Position: Sitting, Cuff Size: Large)   Pulse 80   Temp (!) 97.5 F (36.4 C) (Temporal)   Resp 16   Wt (!) 407 lb (184.6 kg)   BMI 52.26 kg/m  Vitals:   09/04/19 1558  BP: (!) 156/100  Pulse: 80  Resp: 16  Temp: (!) 97.5 F (36.4 C)  TempSrc: Temporal  Weight: (!) 407 lb (184.6 kg)  Body mass index is 52.26 kg/m.   Physical Exam Exam conducted with a chaperone present.  Constitutional:      General: He is not in acute distress.    Appearance: Normal appearance. He is obese. He is not ill-appearing, toxic-appearing or diaphoretic.  HENT:     Head: Normocephalic and atraumatic.     Right Ear: External ear normal.     Left Ear: External ear normal.     Nose: Nose normal.     Mouth/Throat:      Mouth: Mucous membranes are dry.  Eyes:     Extraocular Movements: Extraocular movements intact.     Pupils: Pupils are equal, round, and reactive to light.  Cardiovascular:     Rate and Rhythm: Normal rate and regular rhythm.     Pulses: Normal pulses.     Heart sounds: Normal heart sounds.  Pulmonary:     Effort: Pulmonary effort is normal. No respiratory distress.     Breath sounds: Normal breath sounds. No stridor. No wheezing, rhonchi or rales.  Chest:     Chest wall: No tenderness.  Abdominal:     General: There is no distension.     Palpations: Abdomen is soft.     Tenderness: There is no abdominal tenderness. There is no right CVA tenderness, left CVA tenderness, guarding or rebound.     Hernia: No hernia is present.  Genitourinary:    Testes: Cremasteric reflex is present.        Right: Mass, tenderness, swelling, testicular hydrocele or varicocele not present. Right testis is descended. Cremasteric reflex is present.         Left: Mass (appoximately 1cm palpable firm round mass side of left testicle left testicle ) present. Tenderness, swelling, testicular hydrocele or varicocele not present. Left testis is descended. Cremasteric reflex is present.      Epididymis:     Right: Normal. No tenderness.     Left: Normal. No tenderness.       Comments: Only left testicle examined, patient declined other testicle or penile exam , preferred to keep boxers on and he pulled testicle through opening of his boxers.  He denies any other concerns.  No appreciable swelling or erythema.  Phren's sign is negative.  Skin:    General: Skin is warm and dry.     Capillary Refill: Capillary refill takes less than 2 seconds.     Findings: No bruising, erythema, lesion or rash.  Neurological:     General: No focal deficit present.     Mental Status: He is alert and oriented to person, place, and time.  Psychiatric:        Mood and Affect: Mood normal.        Behavior: Behavior normal.         Thought Content: Thought content normal.        Judgment: Judgment normal.      No results found for any visits on 09/04/19.     Assessment & Plan    1. Testicular pain, left   - POCT urinalysis dipstick - CBC with Differential/Platelet - Comprehensive Metabolic Panel (CMET) - US Scrotum  2. Testicular mass Suspect cyst, however with family history of early testicular cancer needs further evaluation.  Orders Placed This Encounter  Procedures  . US Scrotum    Scheduling Instructions:     Would like done as soon as possible, not concerned for torsion. Negative  Phrens sign. Call patient       [phone].    Order Specific Question:   Reason for Exam (SYMPTOM  OR DIAGNOSIS REQUIRED)    Answer:   testicular lump right testicle. x 5 days that he noticed. Brother with testicular cancer at age 29 approx.    Order Specific Question:   Preferred imaging location?    Answer:   [facility]  . CBC with Differential/Platelet  . Comprehensive Metabolic Panel (CMET)  . Ambulatory referral to Urology    Referral Priority:   Routine    Referral Type:   Consultation    Referral Reason:   Specialty Services Required    Requested Specialty:   Urology    Number of Visits Requested:   1  . POCT urinalysis dipstick     3. Family history of testicular cancer- brother at age 36  Orders Placed This Encounter  Procedures  . US Scrotum  . CBC with Differential/Platelet  . Comprehensive Metabolic Panel (CMET)  . Ambulatory referral to Urology  . POCT urinalysis dipstick     4. Morbid obesity (HCC) Encourage weight loss, increased physical activity.  5. Essential hypertension Pain today however blood pressure has been elevated, he is non compliant with mediation management. He will return to the office to discuss blood pressure in one week. Keep readings at home, consider ACE/ ARB. CMP, CBC on 09/05/2019 fasting as lab is closed today.  Call office if getting consistently  elevated readings. Discussed hypertension lifestyle modifications,DASH - low sodium diet, normal blood pressure reading, effects of nicotine on hypertension and increased risk for cardiovascular disease.   Discussed RED FLAGS of hypertension and Testicular Pain and when to seek emergency medical attention.  Advised he should keep regular follow up's with primary care.  Recheck blood pressure, after patient takes medication regularly for one week. Return in about 1 week (around 09/11/2019), or if symptoms worsen or fail to improve, for at any time for any worsening symptoms, Go to Emergency room/ urgent care if worse.  Advised patient call the office or your primary care doctor for an appointment if no improvement within 72 hours or if any symptoms change or worsen at any time  Advised ER or urgent Care if after hours or on weekend. Call 911 for emergency symptoms at any time.Patinet verbalized understanding of all instructions given/reviewed and treatment plan and has no further questions or concerns at this time.       The entirety of the information documented in the History of Present Illness, Review of Systems and Physical Exam were personally obtained by me. Portions of this information were initially documented by the  Certified Medical Assistant whose name is documented in Epic and reviewed by me for thoroughness and accuracy.  I have personally performed the exam and reviewed the chart and it is accurate to the best of my knowledge.  Museum/gallery conservator has been used and any errors in dictation or transcription are unintentional.  Eula Fried. Flinchum FNP-C  [facility]\   Jairo Ben, FNP  [facility]

## 2019-09-04 NOTE — Telephone Encounter (Signed)
Five days ago pt noted a lump in his right testicle. Pain is moderate and pt is limping due to moderate pain which he describes as an ache. Pain initially was intermittent, but now is constant. Pt notes mild swelling. No redness, fever, abdominal pain, vomiting or difficulty passing urine. Pt describes his urine as a bright yellow color. Pt is concerned because a sibling has h/o testicular cancer.  Used DT to see if appropriate for OV. DT directed a video visit. Since a video visit will not help with evaluation, called to office and provider stated to make a same day in office visit. Pt advised to be at office at 3:45 for a 4:00 pm appt. Pt verbalized understanding.    Reason for Disposition . Swollen scrotum    Lump in testicle and pain and swelling . [1] Constant pain in scrotum or testicle AND [2] present > 1 hour  Additional Information . Commented on: Blood in urine (red, pink, or tea-colored)    More yellow than usual.  Answer Assessment - Initial Assessment Questions 1. LOCATION and RADIATION: "Where is the pain located?"      Right testicle 2. QUALITY: "What does the pain feel like?"  (e.g., sharp, dull, aching, burning)     ache 3. SEVERITY: "How bad is the pain?"  (Scale 1-10; or mild, moderate, severe)   - MILD (1-3): doesn't interfere with normal activities    - MODERATE (4-7): interferes with normal activities (e.g., work or school) or awakens from sleep   - SEVERE (8-10): excruciating pain, unable to do any normal activities, difficulty walking     Moderate-difficulty walking  4. ONSET: "When did the pain start?"     3-4 days ago 5. PATTERN: "Does it come and go, or has it been constant since it started?"     Initially pain comes and goes and now constant for the past 3 days 6. SCROTAL APPEARANCE: "What does the scrotum look like?" "Is there any swelling or redness?"      Mild swelling 7. HERNIA: "Has a doctor ever told you that you have a hernia?"     no 8. OTHER  SYMPTOMS: "Do you have any other symptoms?" (e.g., fever, abdominal pain, vomiting, difficulty passing urine)     Worsening pain  Protocols used: SCROTAL PAIN-A-AH

## 2019-09-07 ENCOUNTER — Ambulatory Visit
Admission: RE | Admit: 2019-09-07 | Discharge: 2019-09-07 | Disposition: A | Discharge status: Home / Self Care | Payer: No Typology Code available for payment source | Source: Ambulatory Visit | Attending: Adult Health | Admitting: Adult Health

## 2019-09-07 ENCOUNTER — Other Ambulatory Visit: Payer: Self-pay

## 2019-09-07 DIAGNOSIS — Z8043 Family history of malignant neoplasm of testis: Secondary | ICD-10-CM | POA: Insufficient documentation

## 2019-09-07 DIAGNOSIS — N5089 Other specified disorders of the male genital organs: Secondary | ICD-10-CM | POA: Insufficient documentation

## 2019-09-07 DIAGNOSIS — N50812 Left testicular pain: Secondary | ICD-10-CM | POA: Insufficient documentation

## 2019-09-10 NOTE — Progress Notes (Signed)
  [employer]   FYI released to MyChart as well- patient viewed. He has follow up with urology already scheduled. Brother with history testicular cancer.  Comments to Patient Seen by patient Dean Tucker on 09/09/2019 12:21 AM EST    No torsion or mass seen. Two small cysts on right testicle. If these are causing pain or concern I advise to keep follow up with urology.

## 2019-10-04 ENCOUNTER — Ambulatory Visit: Payer: Managed Care, Other (non HMO) | Admitting: Urology

## 2019-10-26 ENCOUNTER — Ambulatory Visit: Payer: No Typology Code available for payment source | Attending: Internal Medicine

## 2019-10-26 DIAGNOSIS — Z23 Encounter for immunization: Secondary | ICD-10-CM

## 2019-10-26 NOTE — Progress Notes (Signed)
   Covid-19 Vaccination Clinic  Name:  Dean Tucker    MRN: [id number] DOB: Jun 25, 1996  10/26/2019  Mr. Showers was observed post Covid-19 immunization for 15 minutes without incident. He was provided with Vaccine Information Sheet and instruction to access the V-Safe system.   [name] was instructed to call 911 with any severe reactions post vaccine: Marland Kitchen Difficulty breathing  . Swelling of face and throat  . A fast heartbeat  . A bad rash all over body  . Dizziness and weakness   Immunizations Administered    Name Date Dose VIS Date Route   Pfizer COVID-19 Vaccine 10/26/2019  8:12 AM 0.3 mL 07/06/2019 Intramuscular   Manufacturer: [employer], [employer]   Lot: [id number] [id number]   NDC: [address]-1

## 2019-11-21 ENCOUNTER — Ambulatory Visit: Payer: No Typology Code available for payment source | Attending: Internal Medicine

## 2019-11-21 DIAGNOSIS — Z23 Encounter for immunization: Secondary | ICD-10-CM

## 2019-11-21 NOTE — Progress Notes (Signed)
.     Covid-19 Vaccination Clinic  Name:  KAIROS PANETTA    MRN: [id number] DOB: 07-31-1995  11/21/2019  Mr. Kueker was observed post Covid-19 immunization for 15 minutes without incident. He was provided with Vaccine Information Sheet and instruction to access the V-Safe system.   Mr. Welch was instructed to call 911 with any severe reactions post vaccine: Marland Kitchen Difficulty breathing  . Swelling of face and throat  . A fast heartbeat  . A bad rash all over body  . Dizziness and weakness   Immunizations Administered    Name Date Dose VIS Date Route   Pfizer COVID-19 Vaccine 11/21/2019  8:22 AM 0.3 mL 09/19/2018 Intramuscular   Manufacturer: [employer], [employer]   Lot: [id number]   NDC: [address]-2

## 2020-04-06 ENCOUNTER — Emergency Department
Admission: EM | Admit: 2020-04-06 | Discharge: 2020-04-06 | Disposition: A | Discharge status: Home / Self Care | Payer: No Typology Code available for payment source

## 2020-04-07 ENCOUNTER — Other Ambulatory Visit: Payer: Self-pay

## 2020-04-07 ENCOUNTER — Ambulatory Visit
Admission: EM | Admit: 2020-04-07 | Discharge: 2020-04-07 | Disposition: A | Discharge status: Home / Self Care | Payer: No Typology Code available for payment source | Attending: Internal Medicine | Admitting: Internal Medicine

## 2020-04-07 ENCOUNTER — Encounter: Payer: Self-pay | Admitting: Emergency Medicine

## 2020-04-07 DIAGNOSIS — M62838 Other muscle spasm: Secondary | ICD-10-CM | POA: Diagnosis not present

## 2020-04-07 DIAGNOSIS — G479 Sleep disorder, unspecified: Secondary | ICD-10-CM

## 2020-04-07 DIAGNOSIS — I10 Essential (primary) hypertension: Secondary | ICD-10-CM

## 2020-04-07 MED ORDER — METHOCARBAMOL 750 MG PO TABS: 750.0000 mg | ORAL_TABLET | Freq: Three times a day (TID) | ORAL | 0 refills | Status: DC

## 2020-04-07 MED ORDER — PREDNISONE 20 MG PO TABS: 20.0000 mg | ORAL_TABLET | Freq: Every day | ORAL | 0 refills | Status: DC

## 2020-04-07 MED ORDER — PREDNISONE 20 MG PO TABS
40.0000 mg | ORAL_TABLET | ORAL | Status: AC
  Administered 2020-04-07: 40 mg via ORAL

## 2020-04-07 NOTE — ED Triage Notes (Signed)
Pt c/o pain in the back of his neck. Started yesterday morning when he woke up. He states pain is worse when he tried to turn his head.

## 2020-04-07 NOTE — ED Provider Notes (Signed)
[facility]    CSN: [id number] Arrival date & time: 04/07/20  1408      History   Chief Complaint Chief Complaint  Patient presents with  . Neck Pain    HPI Dean Tucker is a 24 y.o. male who played discoff 2 days ago and woke this am with pain on her upper thoracic area. Pain gets worse with neck movement and raising his arms. He also walked on the treadmill yesterday and had the TV on which was in front of him.  He d/c his BP med a few months ago, since the last provider he saw told him this was low dose, but has not been keeping up with his readings. He does not want to address this issue today.    Past Medical History:  Diagnosis Date  . ADHD (attention deficit hyperactivity disorder)   . Bipolar disorder (HCC)   . Depression   . Hyperlipidemia   . Hypertension   . Obesity     Patient Active Problem List   Diagnosis Date Noted  . Family history of testicular cancer- brother at age 40  09/04/2019  . Testicular mass 09/04/2019  . Testicular pain, left 09/04/2019  . Essential hypertension 05/03/2018  . ADD (attention deficit disorder) 06/18/2015  . H/O manic depressive disorder 06/18/2015  . Hyperlipidemia, mixed 06/18/2015  . Morbid obesity (HCC) 06/18/2015    Past Surgical History:  Procedure Laterality Date  . ADENOIDECTOMY  10/1996  . WISDOM TOOTH EXTRACTION         Home Medications    Prior to Admission medications   Medication Sig Start Date End Date Taking? Authorizing Provider  hydrochlorothiazide (HYDRODIURIL) 25 MG tablet TAKE 1 TABLET BY MOUTH EVERY DAY 02/02/19 04/07/20  Chrismon, Jodell Cipro, PA    Family History Family History  Problem Relation Age of Onset  . Healthy Mother   . Healthy Sister   . Healthy Brother   . Healthy Daughter   . Hypertension Maternal Grandmother   . Hypertension Maternal Grandfather   . Diabetes Maternal Grandfather        pre-diabetic  . Healthy Brother   . Healthy Brother   . Healthy Brother    . Healthy Brother   . Healthy Father     Social History Social History   Tobacco Use  . Smoking status: Current Some Day Smoker    Types: E-cigarettes, Cigarettes  . Smokeless tobacco: Never Used  Vaping Use  . Vaping Use: Some days  Substance Use Topics  . Alcohol use: Yes    Alcohol/week: 0.0 standard drinks    Comment: occasional   . Drug use: Yes    Types: Marijuana    Comment: last use yesterday     Allergies   Patient has no known allergies.   Review of Systems Review of Systems  Constitutional: Negative for activity change, appetite change and fever.  HENT: Negative for congestion, rhinorrhea and tinnitus.   Respiratory: Negative for chest tightness and shortness of breath.   Cardiovascular: Negative for chest pain and leg swelling.  Endocrine:       He is working on wt  loss  Musculoskeletal: Negative for gait problem.  Skin: Negative for rash and wound.  Neurological: Positive for headaches. Negative for numbness.       + hypersomnia  Hematological: Negative for adenopathy.     Physical Exam Triage Vital Signs ED Triage Vitals  Enc Vitals Group     BP 04/07/20 1622 Marland Kitchen)  192/123     Pulse Rate 04/07/20 1622 80     Resp 04/07/20 1622 18     Temp 04/07/20 1622 98 F (36.7 C)     Temp Source 04/07/20 1622 Oral     SpO2 04/07/20 1622 99 %     Weight 04/07/20 1619 (!) 406 lb 15.5 oz (184.6 kg)     Height 04/07/20 1619 6\' 2"  (1.88 m)     Head Circumference --      Peak Flow --      Pain Score 04/07/20 1619 8     Pain Loc --      Pain Edu? --      Excl. in GC? --    No data found.  Updated Vital Signs BP (!) 192/123 (BP Location: Right Arm)   Pulse 80   Temp 98 F (36.7 C) (Oral)   Resp 18   Ht 6\' 2"  (1.88 m)   Wt (!) 406 lb 15.5 oz (184.6 kg)   SpO2 99%   BMI 52.25 kg/m   Visual Acuity Right Eye Distance:   Left Eye Distance:   Bilateral Distance:    Right Eye Near:   Left Eye Near:    Bilateral Near:     Physical  Exam Constitutional:      General: He is not in acute distress.    Appearance: He is obese. He is not toxic-appearing.  HENT:     Head: Normocephalic.     Right Ear: External ear normal.     Left Ear: External ear normal.  Eyes:     General: No scleral icterus.    Conjunctiva/sclera: Conjunctivae normal.     Pupils: Pupils are equal, round, and reactive to light.  Neck:     Comments: Has slightly decreased rotation due to pain which provokes his pain. Upper traps are tense and tender, but I could not provoke the pain on upper thoracic region. Only was provoked with ROM of his neck.  Pulmonary:     Effort: Pulmonary effort is normal.  Musculoskeletal:        General: Normal range of motion.     Cervical back: Tenderness present. No rigidity.  Lymphadenopathy:     Cervical: No cervical adenopathy.  Skin:    General: Skin is warm and dry.     Findings: No rash.  Neurological:     Mental Status: He is alert and oriented to person, place, and time.     Motor: No weakness.     Gait: Gait normal.     Deep Tendon Reflexes: Reflexes normal.  Psychiatric:        Mood and Affect: Mood normal.        Behavior: Behavior normal.        Thought Content: Thought content normal.        Judgment: Judgment normal.      UC Treatments / Results  Labs (all labs ordered are listed, but only abnormal results are displayed) Labs Reviewed - No data to display  EKG   Radiology No results found.  Procedures Procedures (including critical care time)  Medications Ordered in UC Medications - No data to display  Initial Impression / Assessment and Plan / UC Course  I have reviewed the triage vital signs and the nursing notes. Seems to have upper thoracic pain and trapezius spasms and will have him try prednisone and Robaxen. Advised to see his PCP for elevated BO and in the mean time do BP  diaries. I offered to ran labs so I can place him on medication for his BP, but he declined. He was  also given information about sleep clinic.  Final Clinical Impressions(s) / UC Diagnoses   Final diagnoses:  None   Discharge Instructions   None    ED Prescriptions    None     PDMP not reviewed this encounter.   Garey Ham, Cordelia Poche 04/07/20 2119

## 2020-05-02 ENCOUNTER — Ambulatory Visit
Admission: RE | Admit: 2020-05-02 | Discharge: 2020-05-02 | Disposition: A | Discharge status: Home / Self Care | Payer: No Typology Code available for payment source | Attending: Family Medicine | Admitting: Family Medicine

## 2020-05-02 ENCOUNTER — Ambulatory Visit (INDEPENDENT_AMBULATORY_CARE_PROVIDER_SITE_OTHER): Payer: No Typology Code available for payment source | Admitting: Family Medicine

## 2020-05-02 ENCOUNTER — Encounter: Payer: Self-pay | Admitting: Family Medicine

## 2020-05-02 ENCOUNTER — Other Ambulatory Visit: Payer: Self-pay

## 2020-05-02 ENCOUNTER — Ambulatory Visit
Admission: RE | Admit: 2020-05-02 | Discharge: 2020-05-02 | Disposition: A | Discharge status: Home / Self Care | Payer: No Typology Code available for payment source | Source: Ambulatory Visit | Attending: Family Medicine | Admitting: Family Medicine

## 2020-05-02 VITALS — BP 180/118 | HR 84 | Temp 98.3°F | Wt >= 6400 oz

## 2020-05-02 DIAGNOSIS — M25571 Pain in right ankle and joints of right foot: Secondary | ICD-10-CM | POA: Diagnosis not present

## 2020-05-02 DIAGNOSIS — E782 Mixed hyperlipidemia: Secondary | ICD-10-CM

## 2020-05-02 DIAGNOSIS — I10 Essential (primary) hypertension: Secondary | ICD-10-CM | POA: Diagnosis not present

## 2020-05-02 DIAGNOSIS — Z6841 Body Mass Index (BMI) 40.0 and over, adult: Secondary | ICD-10-CM

## 2020-05-02 MED ORDER — LISINOPRIL-HYDROCHLOROTHIAZIDE 10-12.5 MG PO TABS: 1.0000 | ORAL_TABLET | Freq: Every day | ORAL | 3 refills | Status: DC

## 2020-05-02 NOTE — Progress Notes (Signed)
Acute Office Visit  Subjective:    Patient ID: Dean Tucker, male    DOB: 1996-02-25, 24 y.o.   MRN: [id number]  No chief complaint on file.   HPI Patient is in today for right ankle pain and swelling.  He states he has had no known trauma.  He noticed for the last 4 days he has been having the pain.  He is on his feet a lot at work and states that the day he had off from work his ankle felt a little better.  He has taken Tylenol once but nothing else.  The Tylenol did not give him any relief. Pain radiates up his leg and is worse with standing and walking.  Past Medical History:  Diagnosis Date   ADHD (attention deficit hyperactivity disorder)    Bipolar disorder (HCC)    Depression    Hyperlipidemia    Hypertension    Obesity     Past Surgical History:  Procedure Laterality Date   ADENOIDECTOMY  10/1996   WISDOM TOOTH EXTRACTION      Family History  Problem Relation Age of Onset   Healthy Mother    Healthy Sister    Healthy Brother    Healthy Daughter    Hypertension Maternal Grandmother    Hypertension Maternal Grandfather    Diabetes Maternal Grandfather        pre-diabetic   Healthy Brother    Healthy Brother    Healthy Brother    Healthy Brother    Healthy Father     Social History   Socioeconomic History   Marital status: Single    Spouse name: Not on file   Number of children: Not on file   Years of education: Not on file   Highest education level: Not on file  Occupational History   Not on file  Tobacco Use   Smoking status: Current Some Day Smoker    Types: E-cigarettes, Cigarettes   Smokeless tobacco: Never Used  Building services engineer Use: Some days  Substance and Sexual Activity   Alcohol use: Yes    Alcohol/week: 0.0 standard drinks    Comment: occasional    Drug use: Yes    Types: Marijuana    Comment: last use yesterday   Sexual activity: Yes    Partners: Female    Birth control/protection: Condom  Other Topics Concern   Not  on file  Social History Narrative   Not on file   Social Determinants of Health   Financial Resource Strain:    Difficulty of Paying Living Expenses: Not on file  Food Insecurity:    Worried About Programme researcher, broadcasting/film/video in the Last Year: Not on file   [employer] of Food in the Last Year: Not on file  Transportation Needs:    Lack of Transportation (Medical): Not on file   Lack of Transportation (Non-Medical): Not on file  Physical Activity:    Days of Exercise per Week: Not on file   Minutes of Exercise per Session: Not on file  Stress:    Feeling of Stress : Not on file  Social Connections:    Frequency of Communication with Friends and Family: Not on file   Frequency of Social Gatherings with Friends and Family: Not on file   Attends Religious Services: Not on file   Active Member of Clubs or Organizations: Not on file   Attends Banker Meetings: Not on file   Marital Status: Not  on file  Intimate Partner Violence:    Fear of Current or Ex-Partner: Not on file   Emotionally Abused: Not on file   Physically Abused: Not on file   Sexually Abused: Not on file    Outpatient Medications Prior to Visit  Medication Sig Dispense Refill   methocarbamol (ROBAXIN) 750 MG tablet Take 1 tablet (750 mg total) by mouth 3 (three) times daily. 30 tablet 0   predniSONE (DELTASONE) 20 MG tablet Take 1 tablet (20 mg total) by mouth daily with breakfast. 5 tablet 0   No facility-administered medications prior to visit.    No Known Allergies  Review of Systems  Musculoskeletal: Positive for arthralgias.       Objective:    Physical Exam Constitutional:      General: He is not in acute distress.    Appearance: He is well-developed. He is obese.  HENT:     Head: Normocephalic and atraumatic.     Right Ear: Hearing normal.     Left Ear: Hearing normal.     Nose: Nose normal.  Eyes:     General: Lids are normal. No scleral icterus.       Right eye: No discharge.         Left eye: No discharge.     Conjunctiva/sclera: Conjunctivae normal.  Cardiovascular:     Rate and Rhythm: Normal rate and regular rhythm.     Pulses: Normal pulses.     Heart sounds: Normal heart sounds.  Pulmonary:     Effort: Pulmonary effort is normal. No respiratory distress.     Breath sounds: Normal breath sounds.  Abdominal:     General: Bowel sounds are normal.     Palpations: Abdomen is soft.  Musculoskeletal:        General: Swelling present. Normal range of motion.     Cervical back: Neck supple.     Comments: Right ankle with some swelling and pinkness. Fair ROM but soreness.  Skin:    Findings: No lesion or rash.  Neurological:     Mental Status: He is alert and oriented to person, place, and time.  Psychiatric:        Speech: Speech normal.        Behavior: Behavior normal.        Thought Content: Thought content normal.    BP (!) 180/118 (BP Location: Right Arm, Patient Position: Sitting, Cuff Size: Large)   Pulse 84   Temp 98.3 F (36.8 C) (Oral)   Wt (!) 419 lb (190.1 kg)   SpO2 99%   BMI 53.80 kg/m   BP Readings from Last 3 Encounters:  05/02/20 (!) 180/118  04/07/20 (!) 192/123  09/04/19 (!) 156/100    Wt Readings from Last 3 Encounters:  04/07/20 (!) 413 lb (187.3 kg)  09/04/19 (!) 407 lb (184.6 kg)  08/19/19 (!) 406 lb 15.5 oz (184.6 kg)    Health Maintenance Due  Topic Date Due   Hepatitis C Screening  Never done   HIV Screening  Never done   TETANUS/TDAP  02/18/2018   INFLUENZA VACCINE  02/24/2020    There are no preventive care reminders to display for this patient.   Lab Results  Component Value Date   TSH 1.550 11/21/2018   Lab Results  Component Value Date   WBC 8.4 11/21/2018   HGB 15.4 11/21/2018   HCT 44.9 11/21/2018   MCV 87 11/21/2018   PLT 322 11/21/2018   Lab Results  Component  Value Date   NA 139 11/21/2018   K 4.3 11/21/2018   CO2 19 (L) 11/21/2018   GLUCOSE 99 11/21/2018   BUN 14 11/21/2018   CREATININE  0.67 (L) 11/21/2018   BILITOT 0.2 11/21/2018   ALKPHOS 59 11/21/2018   AST 19 11/21/2018   ALT 49 (H) 11/21/2018   PROT 7.3 11/21/2018   ALBUMIN 4.6 11/21/2018   CALCIUM 10.1 11/21/2018   ANIONGAP 5 11/05/2016   Lab Results  Component Value Date   CHOL 240 (H) 11/21/2018   Lab Results  Component Value Date   HDL 33 (L) 11/21/2018   Lab Results  Component Value Date   LDLCALC 161 (H) 11/21/2018   Lab Results  Component Value Date   TRIG 230 (H) 11/21/2018   Lab Results  Component Value Date   CHOLHDL 7.3 (H) 11/21/2018   No results found for: HGBA1C     Assessment & Plan:   1. Acute right ankle pain Swelling and pain in the right ankle with some mild erythema the past 4 days. No known injury. Will get x-ray evaluation and check labs for gout or infection. May use Ibuprofen or Aleve for discomfort and elevate foot as often as possible. - CBC with Differential/Platelet - DG Ankle Complete Right - Uric acid  2. Essential hypertension BP very high today. Will start Lisinopril-HCTZ 10-12.5 mg qd and get routine labs. Recheck BP in 2-3 weeks. Monitor BP at home. - Comprehensive metabolic panel - CBC with Differential/Platelet - Lipid panel - TSH  3. Hyperlipidemia, mixed Long history of high cholesterol and triglycerides. Must work on weight loss, low fat diet and increase in water intake. Recheck labs and follow up pending reports. - Comprehensive metabolic panel - Lipid panel - TSH  4. Class 3 severe obesity due to excess calories with serious comorbidity and body mass index (BMI) of 50.0 to 59.9 in adult (HCC) Significant morbid obesity. Follow up has been sporadic. May be related to Bipolar Disorder. Check labs for metabolic disorders and plan follow up pending reports. - Comprehensive metabolic panel - CBC with Differential/Platelet - Lipid panel - TSH - Hemoglobin A1c       Adline Peals, CMA

## 2020-05-03 LAB — COMPREHENSIVE METABOLIC PANEL
ALT: 44 IU/L (ref 0–44)
AST: 18 IU/L (ref 0–40)
Albumin/Globulin Ratio: 1.7 (ref 1.2–2.2)
Albumin: 4.5 g/dL (ref 4.1–5.2)
Alkaline Phosphatase: 88 IU/L (ref 44–121)
BUN/Creatinine Ratio: 15 (ref 9–20)
BUN: 11 mg/dL (ref 6–20)
Bilirubin Total: 0.3 mg/dL (ref 0.0–1.2)
CO2: 19 mmol/L — ABNORMAL LOW (ref 20–29)
Calcium: 9.6 mg/dL (ref 8.7–10.2)
Chloride: 107 mmol/L — ABNORMAL HIGH (ref 96–106)
Creatinine, Ser: 0.72 mg/dL — ABNORMAL LOW (ref 0.76–1.27)
GFR calc Af Amer: 151 mL/min/{1.73_m2} (ref >59)
GFR calc non Af Amer: 131 mL/min/{1.73_m2} (ref >59)
Globulin, Total: 2.6 g/dL (ref 1.5–4.5)
Glucose: 78 mg/dL (ref 65–99)
Potassium: 4.3 mmol/L (ref 3.5–5.2)
Sodium: 142 mmol/L (ref 134–144)
Total Protein: 7.1 g/dL (ref 6.0–8.5)

## 2020-05-03 LAB — CBC WITH DIFFERENTIAL/PLATELET
Basophils Absolute: 0.1 10*3/uL (ref 0.0–0.2)
Basos: 1 %
EOS (ABSOLUTE): 0.1 10*3/uL (ref 0.0–0.4)
Eos: 1 %
Hematocrit: 46.5 % (ref 37.5–51.0)
Hemoglobin: 15.5 g/dL (ref 13.0–17.7)
Immature Grans (Abs): 0.1 10*3/uL (ref 0.0–0.1)
Immature Granulocytes: 1 %
Lymphocytes Absolute: 3.1 10*3/uL (ref 0.7–3.1)
Lymphs: 31 %
MCH: 28.8 pg (ref 26.6–33.0)
MCHC: 33.3 g/dL (ref 31.5–35.7)
MCV: 86 fL (ref 79–97)
Monocytes Absolute: 0.7 10*3/uL (ref 0.1–0.9)
Monocytes: 7 %
Neutrophils Absolute: 5.8 10*3/uL (ref 1.4–7.0)
Neutrophils: 59 %
Platelets: 335 10*3/uL (ref 150–450)
RBC: 5.38 x10E6/uL (ref 4.14–5.80)
RDW: 13.1 % (ref 11.6–15.4)
WBC: 9.8 10*3/uL (ref 3.4–10.8)

## 2020-05-03 LAB — HEMOGLOBIN A1C
Est. average glucose Bld gHb Est-mCnc: 117 mg/dL
Hgb A1c MFr Bld: 5.7 % — ABNORMAL HIGH (ref 4.8–5.6)

## 2020-05-03 LAB — LIPID PANEL
Chol/HDL Ratio: 6.9 ratio — ABNORMAL HIGH (ref 0.0–5.0)
Cholesterol, Total: 254 mg/dL — ABNORMAL HIGH (ref 100–199)
HDL: 37 mg/dL — ABNORMAL LOW (ref >39)
LDL Chol Calc (NIH): 182 mg/dL — ABNORMAL HIGH (ref 0–99)
Triglycerides: 185 mg/dL — ABNORMAL HIGH (ref 0–149)
VLDL Cholesterol Cal: 35 mg/dL (ref 5–40)

## 2020-05-03 LAB — URIC ACID: Uric Acid: 9.2 mg/dL — ABNORMAL HIGH (ref 3.8–8.4)

## 2020-05-03 LAB — TSH: TSH: 0.918 u[IU]/mL (ref 0.450–4.500)

## 2020-05-08 ENCOUNTER — Telehealth: Payer: Self-pay | Admitting: Physician Assistant

## 2020-05-08 MED ORDER — ALLOPURINOL 100 MG PO TABS: 100.0000 mg | ORAL_TABLET | Freq: Every day | ORAL | 0 refills | Status: DC

## 2020-05-08 NOTE — Telephone Encounter (Signed)
Notified pt. of result note per Dortha Kern from 05/05/20.  Per written order, Allopurinol 100 mg., 1 tab, po, qd; #90; no refills sent to pharmacy.  Pt. Verb. Understanding.

## 2020-05-14 ENCOUNTER — Other Ambulatory Visit: Payer: Self-pay

## 2020-05-14 DIAGNOSIS — E790 Hyperuricemia without signs of inflammatory arthritis and tophaceous disease: Secondary | ICD-10-CM

## 2020-05-14 NOTE — Progress Notes (Signed)
Uric acid

## 2020-12-07 ENCOUNTER — Ambulatory Visit
Admission: EM | Admit: 2020-12-07 | Discharge: 2020-12-07 | Disposition: A | Discharge status: Home / Self Care | Payer: No Typology Code available for payment source | Attending: Physician Assistant | Admitting: Physician Assistant

## 2020-12-07 ENCOUNTER — Other Ambulatory Visit: Payer: Self-pay

## 2020-12-07 ENCOUNTER — Encounter: Payer: Self-pay | Admitting: Emergency Medicine

## 2020-12-07 DIAGNOSIS — Z23 Encounter for immunization: Secondary | ICD-10-CM

## 2020-12-07 DIAGNOSIS — I10 Essential (primary) hypertension: Secondary | ICD-10-CM

## 2020-12-07 DIAGNOSIS — S41111A Laceration without foreign body of right upper arm, initial encounter: Secondary | ICD-10-CM

## 2020-12-07 MED ORDER — TETANUS-DIPHTH-ACELL PERTUSSIS 5-2.5-18.5 LF-MCG/0.5 IM SUSY
0.5000 mL | PREFILLED_SYRINGE | Freq: Once | INTRAMUSCULAR | Status: AC
  Administered 2020-12-07: 0.5 mL via INTRAMUSCULAR

## 2020-12-07 MED ORDER — LISINOPRIL-HYDROCHLOROTHIAZIDE 10-12.5 MG PO TABS: 1.0000 | ORAL_TABLET | Freq: Every day | ORAL | 1 refills | Status: DC

## 2020-12-07 NOTE — ED Provider Notes (Signed)
[facility]    CSN: [id number] Arrival date & time: 12/07/20  1252      History   Chief Complaint Chief Complaint  Patient presents with  . Laceration    Right arm    HPI Dean Tucker is a 25 y.o. male presenting for laceration of the right dorsal forearm.  Patient says that he was quickly running through his dark home and cut the arm on a piece of glass last night when he was drinking at a party.  He says he did not realize that it was deep until today.  He did clean it with hydrogen peroxide yesterday.  He has had a bandage on it and says it is continued to bleed a little.  He admits to some pain when it is touched, otherwise says its bearable.  No numbness or tingling.  No problems with moving any of his joints.  He did not fall down, hit his head or pass out.  Patient's blood pressure is elevated at 196/125.  He has a history of hypertension.  Patient says he cannot remember to take his blood pressure medication.  He says he may need a refill but he is not sure.  Tetanus not up-to-date.  No other concerns.  HPI  Past Medical History:  Diagnosis Date  . ADHD (attention deficit hyperactivity disorder)   . Bipolar disorder (HCC)   . Depression   . Hyperlipidemia   . Hypertension   . Obesity     Patient Active Problem List   Diagnosis Date Noted  . Family history of testicular cancer- brother at age 10  09/04/2019  . Testicular mass 09/04/2019  . Testicular pain, left 09/04/2019  . Essential hypertension 05/03/2018  . ADD (attention deficit disorder) 06/18/2015  . H/O manic depressive disorder 06/18/2015  . Hyperlipidemia, mixed 06/18/2015  . Morbid obesity (HCC) 06/18/2015    Past Surgical History:  Procedure Laterality Date  . ADENOIDECTOMY  10/1996  . WISDOM TOOTH EXTRACTION         Home Medications    Prior to Admission medications   Medication Sig Start Date End Date Taking? Authorizing Provider  allopurinol (ZYLOPRIM) 100 MG tablet Take 1  tablet (100 mg total) by mouth daily. Remind pt. He should have uric acid level rechecked in 6-8 weeks. 05/08/20   Chrismon, Jodell Cipro, PA-C  lisinopril-hydrochlorothiazide (ZESTORETIC) 10-12.5 MG tablet Take 1 tablet by mouth daily. 12/07/20 01/06/21  Shirlee Latch, PA-C  methocarbamol (ROBAXIN) 750 MG tablet Take 1 tablet (750 mg total) by mouth 3 (three) times daily. Patient not taking: No sig reported 04/07/20   Rodriguez-Southworth, Nettie Elm, PA-C  hydrochlorothiazide (HYDRODIURIL) 25 MG tablet TAKE 1 TABLET BY MOUTH EVERY DAY 02/02/19 04/07/20  Chrismon, Jodell Cipro, PA-C    Family History Family History  Problem Relation Age of Onset  . Healthy Mother   . Healthy Sister   . Healthy Brother   . Healthy Daughter   . Hypertension Maternal Grandmother   . Hypertension Maternal Grandfather   . Diabetes Maternal Grandfather        pre-diabetic  . Healthy Brother   . Healthy Brother   . Healthy Brother   . Healthy Brother   . Healthy Father     Social History Social History   Tobacco Use  . Smoking status: Current Some Day Smoker    Types: E-cigarettes, Cigarettes  . Smokeless tobacco: Never Used  Vaping Use  . Vaping Use: Some days  Substance Use Topics  .  Alcohol use: Yes    Alcohol/week: 0.0 standard drinks    Comment: occasional   . Drug use: Yes    Types: Marijuana    Comment: last use yesterday     Allergies   Patient has no known allergies.   Review of Systems Review of Systems  Respiratory: Negative for shortness of breath.   Cardiovascular: Negative for chest pain and palpitations.  Musculoskeletal: Negative for arthralgias and joint swelling.  Skin: Positive for wound.  Neurological: Negative for dizziness, weakness, numbness and headaches.     Physical Exam Triage Vital Signs ED Triage Vitals  Enc Vitals Group     BP 12/07/20 1314 (S) (!) 196/125     Pulse Rate 12/07/20 1314 78     Resp 12/07/20 1314 16     Temp 12/07/20 1314 98.1 F (36.7 C)      Temp Source 12/07/20 1314 Oral     SpO2 12/07/20 1314 100 %     Weight 12/07/20 1311 (!) 387 lb (175.5 kg)     Height 12/07/20 1311 6\' 2"  (1.88 m)     Head Circumference --      Peak Flow --      Pain Score 12/07/20 1311 4     Pain Loc --      Pain Edu? --      Excl. in GC? --    No data found.  Updated Vital Signs BP (S) (!) 196/125 (BP Location: Left Arm) Comment: Patient has not been taking his BP medicine  Pulse 78   Temp 98.1 F (36.7 C) (Oral)   Resp 16   Ht 6\' 2"  (1.88 m)   Wt (!) 387 lb (175.5 kg)   SpO2 100%   BMI 49.69 kg/m       Physical Exam Vitals and nursing note reviewed.  Constitutional:      General: He is not in acute distress.    Appearance: Normal appearance. He is well-developed. He is obese. He is not ill-appearing.  HENT:     Head: Normocephalic and atraumatic.  Eyes:     General: No scleral icterus.    Conjunctiva/sclera: Conjunctivae normal.  Cardiovascular:     Rate and Rhythm: Normal rate and regular rhythm.     Heart sounds: Normal heart sounds.  Pulmonary:     Effort: Pulmonary effort is normal. No respiratory distress.     Breath sounds: Normal breath sounds.  Musculoskeletal:     Cervical back: Neck supple.  Skin:    General: Skin is warm and dry.     Findings: Wound (2 cm laceration dorsal right forearm. No active bleeding. Area is TTP. No contaminatin of wound) present.  Neurological:     General: No focal deficit present.     Mental Status: He is alert. Mental status is at baseline.     Motor: No weakness.     Gait: Gait normal.  Psychiatric:        Mood and Affect: Mood normal.        Behavior: Behavior normal.        Thought Content: Thought content normal.      UC Treatments / Results  Labs (all labs ordered are listed, but only abnormal results are displayed) Labs Reviewed - No data to display  EKG   Radiology No results found.  Procedures Procedures (including critical care time)  Medications Ordered in  UC Medications  Tdap (BOOSTRIX) injection 0.5 mL (0.5 mLs Intramuscular Given 12/07/20 1316)  Initial Impression / Assessment and Plan / UC Course  I have reviewed the triage vital signs and the nursing notes.  Pertinent labs & imaging results that were available during my care of the patient were reviewed by me and considered in my medical decision making (see chart for details).    25 year old male presenting for laceration of the right forearm that he sustained about 15+ hours ago.  Today, I thoroughly cleansed the wound with wound cleaner and chlorhexidine.  Applied 3 Steri-Strips to close the wound and covered with a nonadherent pad and Coban.  Reviewed local wound care with patient.  Did not feel comfortable suturing the wound given the amount of time that has gone by.  Tdap updated.   Patient's blood pressure elevated 196/125.  Patient did admit to not taking his medication regularly and being unsure if he even has any.  I have refilled his lisinopril/hydrochlorothiazide and advised him to follow-up with his PCP.  Advised to keep a log of blood pressure.  Reviewed lifestyle changes for hypertension with patient.  Reviewed ED precautions for hypertension as well.  Work note given.   Final Clinical Impressions(s) / UC Diagnoses   Final diagnoses:  Laceration of right upper extremity, initial encounter  Essential hypertension     Discharge Instructions     We have not sutured your laceration since it has been many hours since he sustained it.  I have cleaned it well and applied Steri-Strips.  Try to leave is intact.  Change the bandage tomorrow.  Look out for any infection follow-up if you see any signs of infection.  Your blood pressure is very high so I have refilled your blood pressure medication, but you need to follow-up with your PCP as soon as possible.  Try to lose weight, exercise and diet.   ED Prescriptions    Medication Sig Dispense Auth. Provider    lisinopril-hydrochlorothiazide (ZESTORETIC) 10-12.5 MG tablet Take 1 tablet by mouth daily. 30 tablet Gareth Morgan     PDMP not reviewed this encounter.   Shirlee Latch, PA-C 12/07/20 1630

## 2020-12-07 NOTE — ED Triage Notes (Signed)
Patient states that he cut his right arm on a glass picture frame last night around 9:00pm.  Patient has a laceration to his right forearm with minimal bleeding.

## 2020-12-07 NOTE — Discharge Instructions (Signed)
We have not sutured your laceration since it has been many hours since he sustained it.  I have cleaned it well and applied Steri-Strips.  Try to leave is intact.  Change the bandage tomorrow.  Look out for any infection follow-up if you see any signs of infection.  Your blood pressure is very high so I have refilled your blood pressure medication, but you need to follow-up with your PCP as soon as possible.  Try to lose weight, exercise and diet.

## 2021-02-12 IMAGING — CR DG ANKLE COMPLETE 3+V*R*
1 series · 3 of 3 positions shown · non-contrast
Comparison: None.

CLINICAL DATA: Right ankle pain and swelling for the past week. No
injury.

EXAM:
RIGHT ANKLE - COMPLETE 3+ VIEW

[Series 1: dg ankle complete right · 0.14mm/px · 3 of 3 slices shown]
[im 1/3]
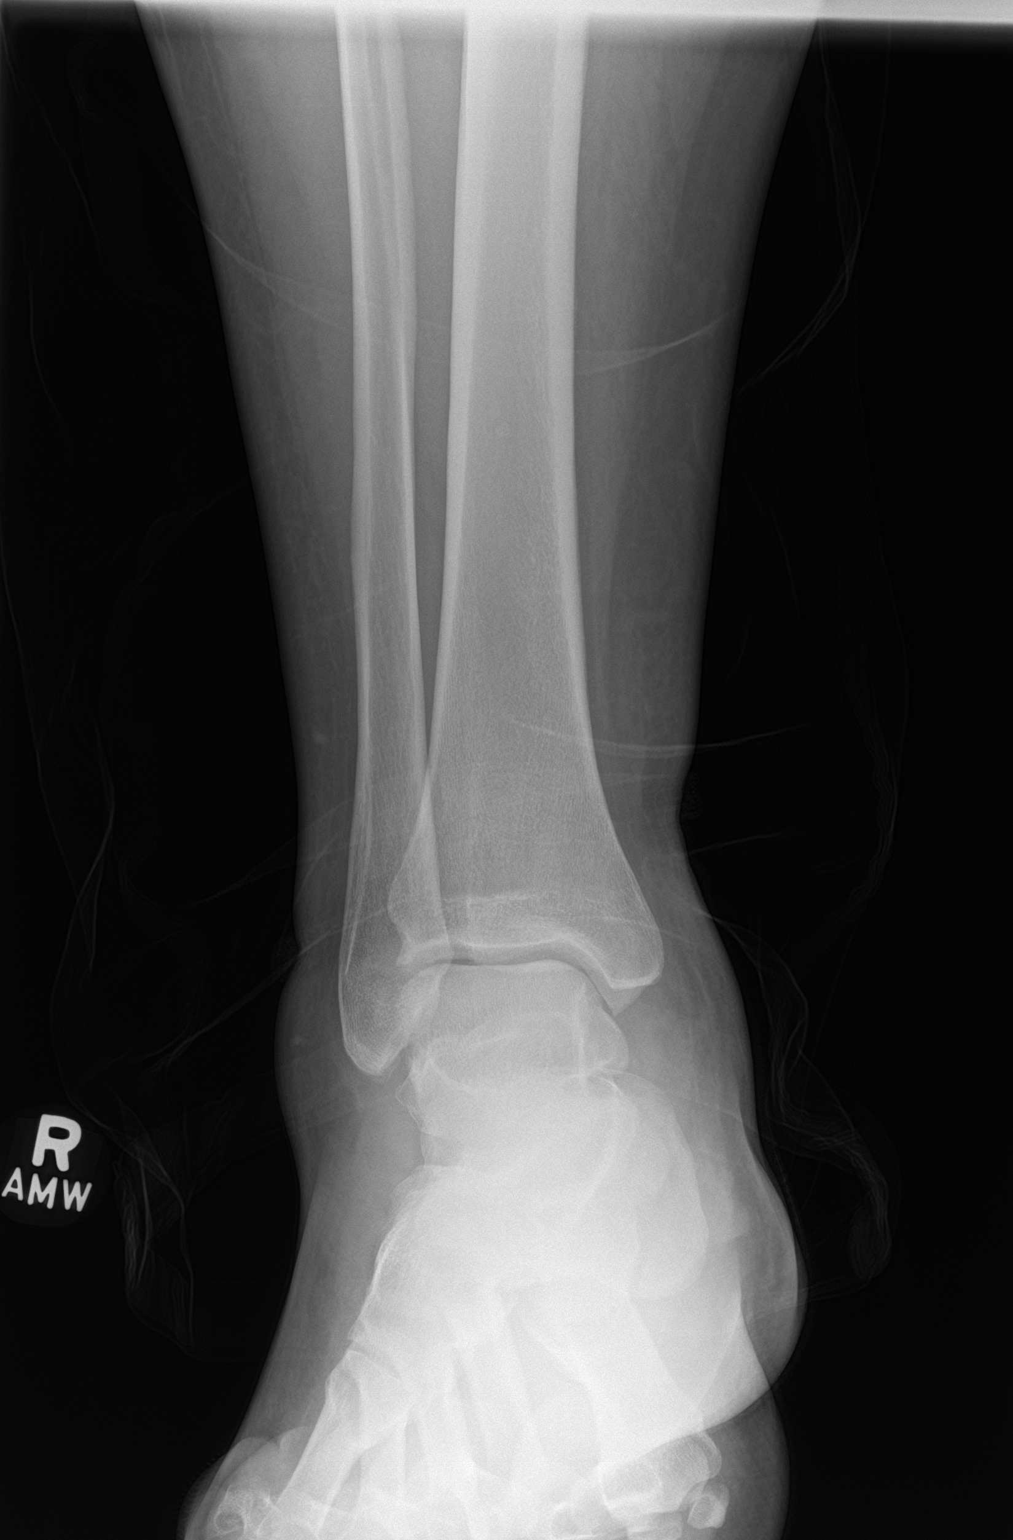
[im 2/3]
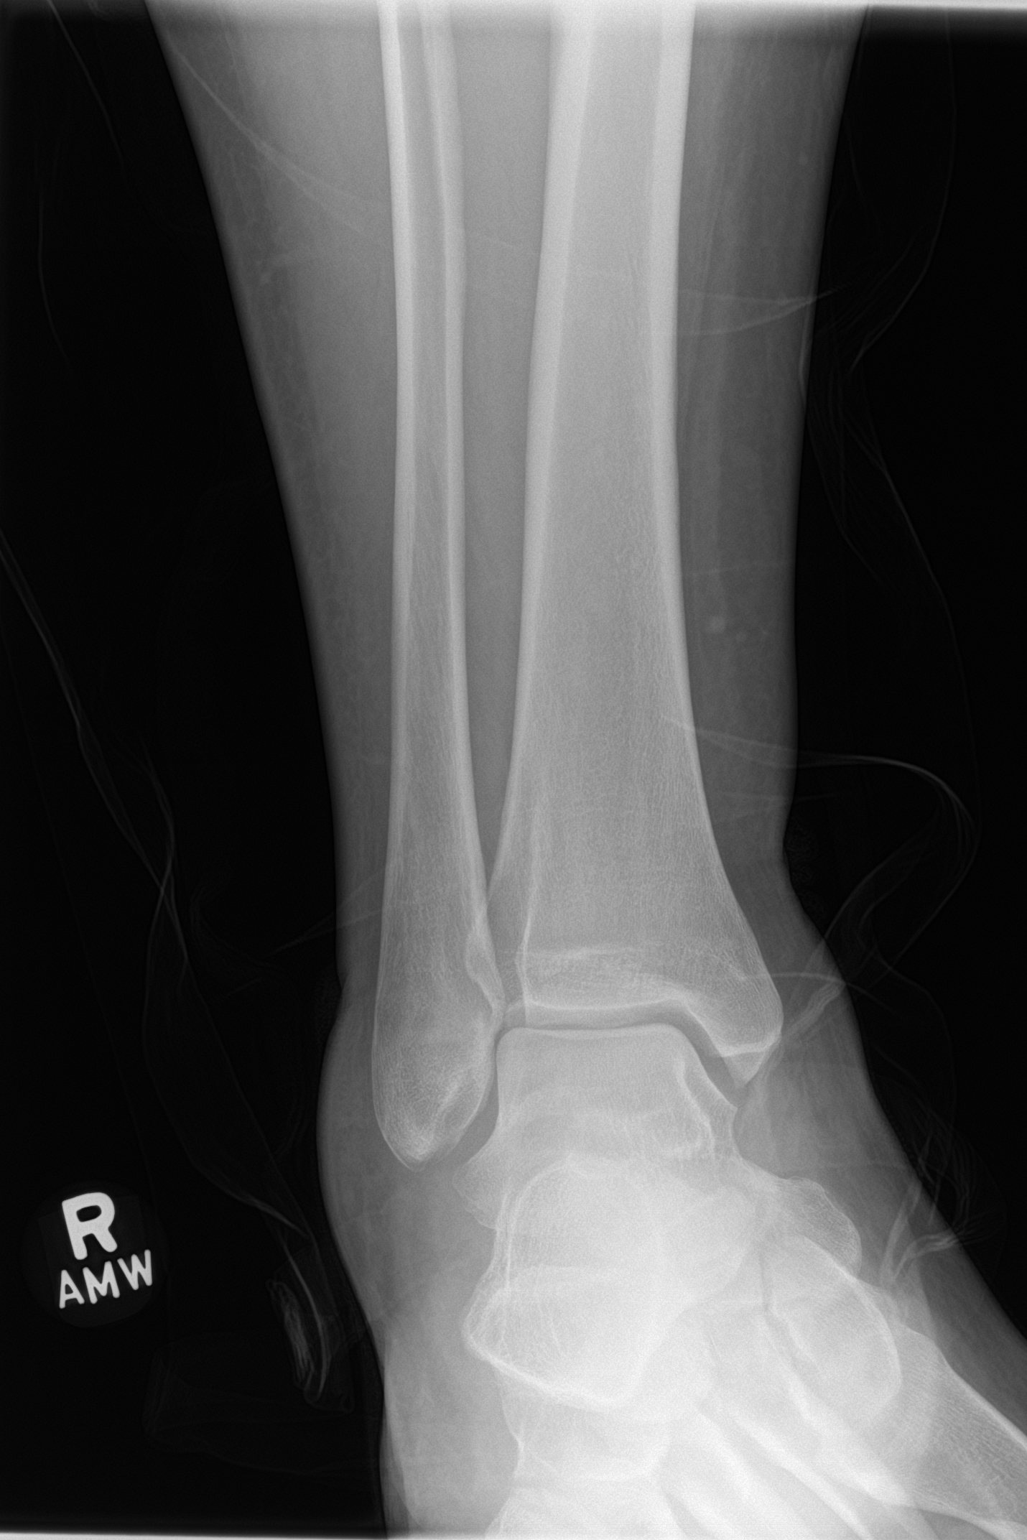
[im 3/3]
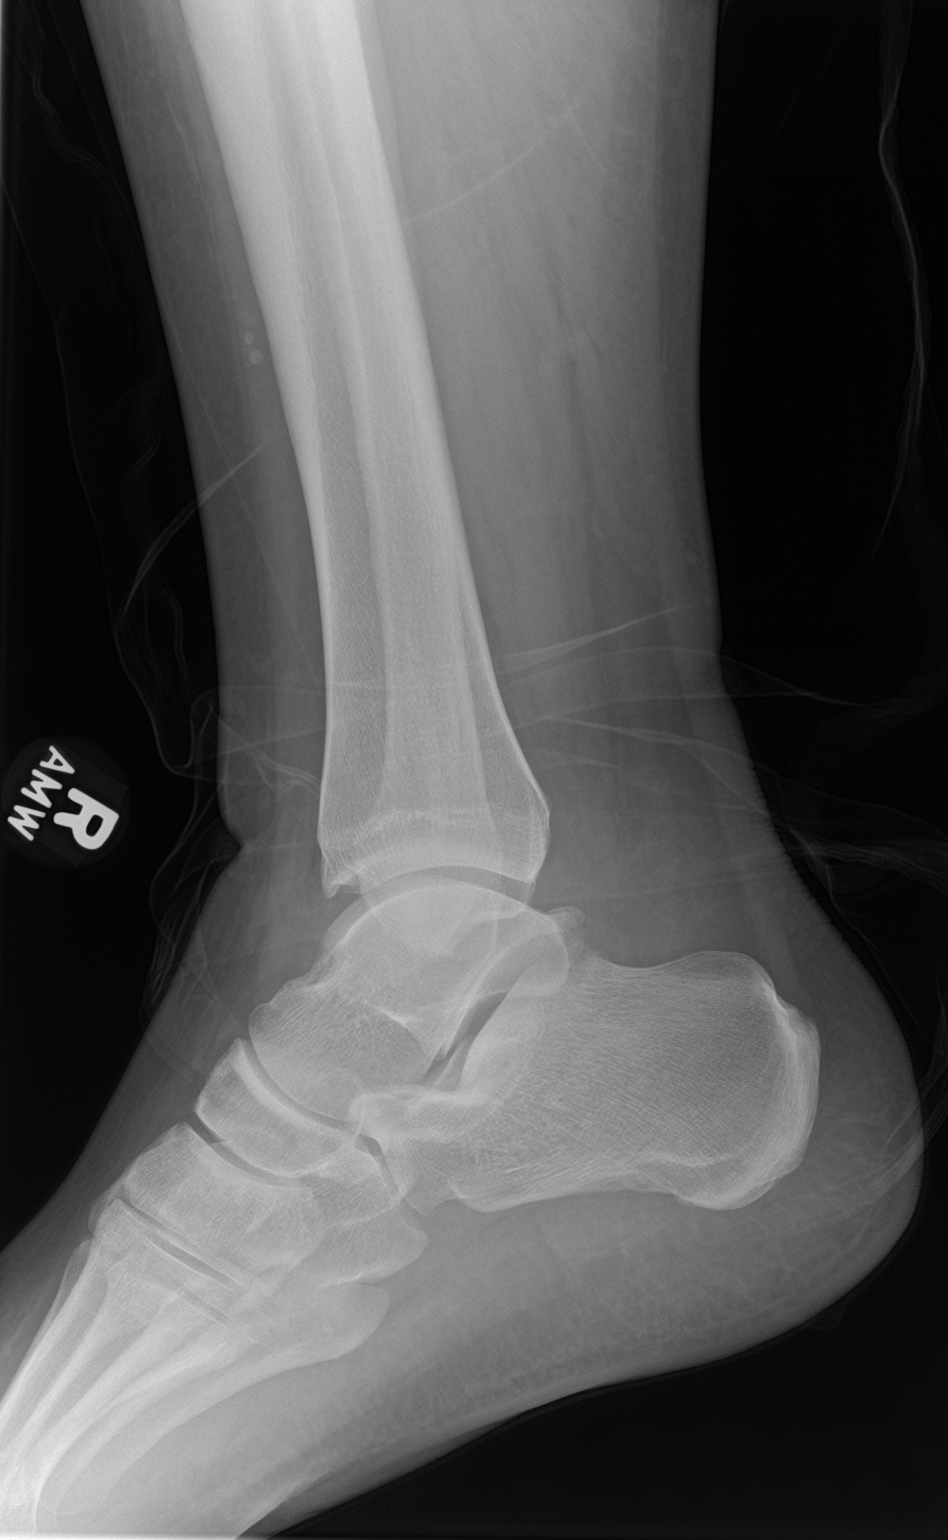

[3 of 3 positions shown; findings below may reference images not displayed]

FINDINGS: No acute fracture or dislocation. Small tibiotalar joint effusion.
Mild spurring of the anterior tibial plafond. Joint spaces are
preserved. Bone mineralization is normal. Mild diffuse soft tissue
swelling.
IMPRESSION: 1. Mild soft tissue swelling and small tibiotalar joint effusion. No
acute osseous abnormality or significant degenerative changes.

## 2021-02-21 ENCOUNTER — Other Ambulatory Visit: Payer: Self-pay

## 2021-02-21 ENCOUNTER — Ambulatory Visit
Admission: RE | Admit: 2021-02-21 | Discharge: 2021-02-21 | Disposition: A | Discharge status: Home / Self Care | Payer: PRIVATE HEALTH INSURANCE | Source: Ambulatory Visit | Attending: Family Medicine | Admitting: Family Medicine

## 2021-02-21 VITALS — BP 180/120 | HR 88 | Temp 98.9°F | Resp 22

## 2021-02-21 DIAGNOSIS — M25571 Pain in right ankle and joints of right foot: Secondary | ICD-10-CM

## 2021-02-21 DIAGNOSIS — I160 Hypertensive urgency: Secondary | ICD-10-CM

## 2021-02-21 MED ORDER — LISINOPRIL-HYDROCHLOROTHIAZIDE 10-12.5 MG PO TABS: 1.0000 | ORAL_TABLET | Freq: Every day | ORAL | 0 refills | Status: DC

## 2021-02-21 MED ORDER — PREDNISONE 10 MG PO TABS: ORAL_TABLET | ORAL | 0 refills | Status: AC

## 2021-02-21 NOTE — Discharge Instructions (Addendum)
Medication as prescribed.  Follow up with [facility].  You need labs in 7-10 days.  Take care  Dr. Adriana Simas

## 2021-02-21 NOTE — ED Triage Notes (Signed)
Pt is here with right ankle pain that started 3 days ago but states its been going on overall over a year now. Pt has taken OTC meds and ice packs to relieve discomfort.

## 2021-02-22 NOTE — ED Provider Notes (Signed)
[facility]    CSN: [id number] Arrival date & time: 02/21/21  1139  History   Chief Complaint Chief Complaint  Patient presents with   Ankle Pain    HPI 25 year old male presents with the above complaint.  Has had ongoing intermittent pain of the right ankle and foot. Per the EMR, has elevated uric acid levels. Right ankle and foot have been bothering him for 3 days. Localizes the pain to the ankle (medially and posteriorly). Pain 7/10 in severity. Worse with activity. He has been taking OTC medication and using ice without relief.  Also, patient requesting refill of his BP medication. He is noncompliant with the BP medication. He would also like to restart Zoloft for depression.  Past Medical History:  Diagnosis Date   ADHD (attention deficit hyperactivity disorder)    Bipolar disorder (HCC)    Depression    Hyperlipidemia    Hypertension    Obesity    Obesity     Patient Active Problem List   Diagnosis Date Noted   Family history of testicular cancer- brother at age 65  09/04/2019   Testicular mass 09/04/2019   Testicular pain, left 09/04/2019   Essential hypertension 05/03/2018   ADD (attention deficit disorder) 06/18/2015   H/O manic depressive disorder 06/18/2015   Hyperlipidemia, mixed 06/18/2015   Morbid obesity (HCC) 06/18/2015    Past Surgical History:  Procedure Laterality Date   ADENOIDECTOMY  10/1996   WISDOM TOOTH EXTRACTION         Home Medications    Prior to Admission medications   Medication Sig Start Date End Date Taking? Authorizing Provider  predniSONE (DELTASONE) 10 MG tablet 50 mg daily x 3 days, then 40 mg daily x 3 days, then 30 mg daily x 3 days, then 20 mg daily x 3 days, then 10 mg daily x 3 days. 02/21/21  Yes Grethel Zenk G, DO  lisinopril-hydrochlorothiazide (ZESTORETIC) 10-12.5 MG tablet Take 1 tablet by mouth daily. 02/21/21 03/23/21  Tommie Sams, DO  hydrochlorothiazide (HYDRODIURIL) 25 MG tablet TAKE 1 TABLET BY  MOUTH EVERY DAY 02/02/19 04/07/20  Chrismon, Jodell Cipro, PA-C    Family History Family History  Problem Relation Age of Onset   Healthy Mother    Healthy Father    Healthy Sister    Healthy Brother    Healthy Brother    Healthy Brother    Healthy Brother    Healthy Brother    Hypertension Maternal Grandmother    Hypertension Maternal Grandfather    Diabetes Maternal Grandfather        pre-diabetic   Healthy Daughter     Social History Social History   Tobacco Use   Smoking status: Some Days    Types: E-cigarettes, Cigarettes   Smokeless tobacco: Never  Vaping Use   Vaping Use: Some days  Substance Use Topics   Alcohol use: Yes    Alcohol/week: 0.0 standard drinks    Comment: occasional    Drug use: Yes    Types: Marijuana     Allergies   Patient has no known allergies.   Review of Systems Review of Systems Per HPI  Physical Exam Triage Vital Signs ED Triage Vitals  Enc Vitals Group     BP 02/21/21 1151 (!) 180/120     Pulse Rate 02/21/21 1151 88     Resp 02/21/21 1151 (!) 22     Temp 02/21/21 1151 98.9 F (37.2 C)     Temp Source  02/21/21 1151 Oral     SpO2 02/21/21 1151 99 %     Weight --      Height --      Head Circumference --      Peak Flow --      Pain Score 02/21/21 1149 7     Pain Loc --      Pain Edu? --      Excl. in GC? --    No data found.  Updated Vital Signs BP (!) 180/120 (BP Location: Left Arm)   Pulse 88   Temp 98.9 F (37.2 C) (Oral)   Resp (!) 22   SpO2 99%   Visual Acuity Right Eye Distance:   Left Eye Distance:   Bilateral Distance:    Right Eye Near:   Left Eye Near:    Bilateral Near:     Physical Exam Vitals and nursing note reviewed.  Constitutional:      General: He is not in acute distress.    Appearance: Normal appearance. He is obese. He is not ill-appearing.  HENT:     Head: Normocephalic and atraumatic.  Eyes:     General:        Right eye: No discharge.        Left eye: No discharge.      Conjunctiva/sclera: Conjunctivae normal.  Cardiovascular:     Rate and Rhythm: Normal rate and regular rhythm.  Pulmonary:     Effort: Pulmonary effort is normal.     Breath sounds: Normal breath sounds.  Musculoskeletal:     Comments: Right ankle - tenderness posteriorly and around the medial malleolus. Decreased ROM.  Neurological:     Mental Status: He is alert.     UC Treatments / Results  Labs (all labs ordered are listed, but only abnormal results are displayed) Labs Reviewed - No data to display  EKG   Radiology No results found.  Procedures Procedures (including critical care time)  Medications Ordered in UC Medications - No data to display  Initial Impression / Assessment and Plan / UC Course  I have reviewed the triage vital signs and the nursing notes.  Pertinent labs & imaging results that were available during my care of the patient were reviewed by me and considered in my medical decision making (see chart for details).    25 year old male presents with ankle pain. Patient likely has gout. Treating with prednisone. Patient with hypertensive urgency/uncontrolled HTN. Restarting Lisinopril/HCTZ. Advised follow with with PCP. Will need labs in 7-10 days. I declined to refill Zoloft today as his history is notable for bipolar disorder.   Final Clinical Impressions(s) / UC Diagnoses   Final diagnoses:  Acute right ankle pain  Hypertensive urgency     Discharge Instructions      Medication as prescribed.  Follow up with [facility].  You need labs in 7-10 days.  Take care  Dr. Adriana Simas    ED Prescriptions     Medication Sig Dispense Auth. Provider   predniSONE (DELTASONE) 10 MG tablet 50 mg daily x 3 days, then 40 mg daily x 3 days, then 30 mg daily x 3 days, then 20 mg daily x 3 days, then 10 mg daily x 3 days. 45 tablet Eliyah Mcshea G, DO   lisinopril-hydrochlorothiazide (ZESTORETIC) 10-12.5 MG tablet Take 1 tablet by mouth daily. 30  tablet Tommie Sams, DO      PDMP not reviewed this encounter.   Tommie Sams,  DO 02/22/21 2206

## 2021-09-10 ENCOUNTER — Encounter: Payer: Self-pay | Admitting: Physician Assistant

## 2021-09-10 ENCOUNTER — Ambulatory Visit (INDEPENDENT_AMBULATORY_CARE_PROVIDER_SITE_OTHER): Payer: BC Managed Care – PPO | Admitting: Physician Assistant

## 2021-09-10 ENCOUNTER — Other Ambulatory Visit: Payer: Self-pay

## 2021-09-10 VITALS — BP 196/113 | HR 99 | Resp 16 | Ht 73.0 in | Wt >= 6400 oz

## 2021-09-10 DIAGNOSIS — I10 Essential (primary) hypertension: Secondary | ICD-10-CM

## 2021-09-10 DIAGNOSIS — I160 Hypertensive urgency: Secondary | ICD-10-CM

## 2021-09-10 DIAGNOSIS — M10072 Idiopathic gout, left ankle and foot: Secondary | ICD-10-CM | POA: Diagnosis not present

## 2021-09-10 MED ORDER — COLCHICINE 0.6 MG PO TABS: 0.6000 mg | ORAL_TABLET | Freq: Two times a day (BID) | ORAL | 0 refills | Status: DC

## 2021-09-10 MED ORDER — COLCHICINE 0.6 MG PO TABS: 0.6000 mg | ORAL_TABLET | Freq: Two times a day (BID) | ORAL | 0 refills | Status: AC

## 2021-09-10 MED ORDER — LISINOPRIL-HYDROCHLOROTHIAZIDE 10-12.5 MG PO TABS: 1.0000 | ORAL_TABLET | Freq: Every day | ORAL | 2 refills | Status: DC

## 2021-09-10 MED ORDER — LISINOPRIL-HYDROCHLOROTHIAZIDE 10-12.5 MG PO TABS: 1.0000 | ORAL_TABLET | Freq: Every day | ORAL | 0 refills | Status: DC

## 2021-09-10 NOTE — Progress Notes (Signed)
Established patient visit   Patient: Dean Tucker   DOB: Aug 28, 1995   26 y.o. Male  MRN: [id number] Visit Date: 09/10/2021  Today's healthcare provider: Dani Gobble Rosaly Labarbera, PA-C  Introduced myself to the patient as a Journalist, newspaper and provided education on APPs in clinical practice.    I,Joseline E Rosas,acting as a scribe for [employer], PA-C.,have documented all relevant documentation on the behalf of [place], PA-C,as directed by  [employer], PA-C while in the presence of Celica Kotowski E Ayvah Caroll, PA-C.   Chief Complaint  Patient presents with   Ankle Pain   Subjective    Ankle Pain  There was no injury mechanism. The pain is present in the left ankle. The quality of the pain is described as aching. The pain is severe. The pain has been Constant since onset. Associated symptoms include numbness and tingling. Pertinent negatives include no inability to bear weight. He reports no foreign bodies present. The symptoms are aggravated by movement and weight bearing. He has tried non-weight bearing and elevation (hot bath, Cherries) for the symptoms. The treatment provided no relief.    States he has very bad pain in his left foot Thinks it's gout as he has had this before  States he cannot put pressure on it due to pain  Pain level 10/10 and is unable to bear weight States this usually subsides in 2-3 days but this is the third day without improving symptoms at all   States he was feeling dizzy yesterday when he was trying to brush teeth States he has been out of BP medication for almost a month due to cost barriers States he has had frequent trips to the UC for this     Medications: Outpatient Medications Prior to Visit  Medication Sig   predniSONE (DELTASONE) 10 MG tablet 50 mg daily x 3 days, then 40 mg daily x 3 days, then 30 mg daily x 3 days, then 20 mg daily x 3 days, then 10 mg daily x 3 days. (Patient not taking: Reported on 09/10/2021)   [DISCONTINUED]  lisinopril-hydrochlorothiazide (ZESTORETIC) 10-12.5 MG tablet Take 1 tablet by mouth daily.   No facility-administered medications prior to visit.    Review of Systems  Constitutional:  Negative for fever.  Eyes:  Negative for visual disturbance.  Cardiovascular:  Positive for leg swelling (unilateral in affected left foot). Negative for chest pain and palpitations.  Musculoskeletal:  Positive for gait problem and joint swelling.  Skin:  Positive for color change.       Reports heat on left dorsal aspect of left foot.  Denies skin breakdown  Neurological:  Positive for tingling, weakness and numbness. Negative for dizziness, light-headedness and headaches.      Objective    BP (!) 196/113 (BP Location: Right Wrist, Patient Position: Sitting, Cuff Size: Large)    Pulse 99    Resp 16    Ht 6\' 1"  (1.854 m)    Wt (!) 420 lb (190.5 kg) Comment: Per patient   BMI 55.41 kg/m  {Show previous vital signs (optional):23777}  Physical Exam Vitals reviewed.  Constitutional:      Appearance: Normal appearance. He is obese.  Pulmonary:     Effort: Pulmonary effort is normal.  Musculoskeletal:     Left foot: Normal capillary refill. Swelling present. No foot drop or laceration.  Skin:    General: Skin is warm.       Neurological:  Mental Status: He is alert.      No results found for any visits on 09/10/21.  Assessment & Plan     Problem List Items Addressed This Visit       Cardiovascular and Mediastinum   Essential hypertension - Primary Chronic, historic condition Poorly managed and unstable at this time Provided refill of previous HTN medication and stressed importance of compliance  Discussed Goodrx to assist with cost barriers Recommend follow up in one month to assess progress Recommend daily BP checks at home and record for follow up visit to assess BP at home    Relevant Medications   lisinopril-hydrochlorothiazide (ZESTORETIC) 10-12.5 MG tablet   Other Relevant  Orders   Comprehensive Metabolic Panel (CMET)   CBC w/Diff/Platelet   Other Visit Diagnoses     Acute idiopathic gout of left foot     Acute, recurrent concern Patient has been seen several times for this condition  Repeat lab work to confirm suspected gout dx  Discussed what to look out for in terms of cellulitis as skin redness and pain could have this etiology although seems unlikely today Provided script for Colchicine to assist with current flare and discussed abortive properties for future flares.    Relevant Medications   colchicine 0.6 MG tablet   Other Relevant Orders   Uric acid   Hypertensive urgency     Acute, recurrent issue Recommend compliance with medications to provide BP control Discussed ED precautions for Hypertensive emergency Recommend follow up in 4 weeks to assess progress    Relevant Medications   lisinopril-hydrochlorothiazide (ZESTORETIC) 10-12.5 MG tablet        Return in about 4 weeks (around 10/08/2021) for HTN.   I, Caisley Baxendale E Manoj Enriquez, PA-C, have reviewed all documentation for this visit. The documentation on 09/10/21 for the exam, diagnosis, procedures, and orders are all accurate and complete.   Jadene Stemmer, Glennie Isle MPH [facility]   Return in about 4 weeks (around 10/08/2021) for HTN.       Almon Register, PA-C  [employer] [phone] (phone) [phone] (fax)  [place]

## 2021-09-10 NOTE — Patient Instructions (Addendum)
I am calling in a script for Colchicine  Please take this by mouth twice per day until the pain resolves   If you have another flare you can start taking it again at the first sign of pain to help reduce symptoms  Please take your Blood Pressure medication every day and measure your BP daily and record it  Bring this record with you to next apt  Please follow up in 4 weeks for your blood pressure.

## 2021-09-10 NOTE — Assessment & Plan Note (Signed)
°  Chronic, historic condition Poorly managed and unstable at this time Provided refill of previous HTN medication and stressed importance of compliance  Discussed Goodrx to assist with cost barriers Recommend follow up in one month to assess progress Recommend daily BP checks at home and record for follow up visit to assess BP at home

## 2021-10-13 ENCOUNTER — Ambulatory Visit: Payer: BC Managed Care – PPO | Admitting: Physician Assistant

## 2021-10-20 ENCOUNTER — Encounter: Payer: Self-pay | Admitting: *Deleted

## 2022-04-13 ENCOUNTER — Emergency Department: Payer: Self-pay

## 2022-04-13 ENCOUNTER — Emergency Department
Admission: EM | Admit: 2022-04-13 | Discharge: 2022-04-13 | Disposition: A | Discharge status: Home / Self Care | Payer: Self-pay | Attending: Emergency Medicine | Admitting: Emergency Medicine

## 2022-04-13 ENCOUNTER — Encounter: Payer: Self-pay | Admitting: Emergency Medicine

## 2022-04-13 DIAGNOSIS — Z79899 Other long term (current) drug therapy: Secondary | ICD-10-CM | POA: Insufficient documentation

## 2022-04-13 DIAGNOSIS — I160 Hypertensive urgency: Secondary | ICD-10-CM

## 2022-04-13 DIAGNOSIS — W108XXA Fall (on) (from) other stairs and steps, initial encounter: Secondary | ICD-10-CM | POA: Insufficient documentation

## 2022-04-13 DIAGNOSIS — I10 Essential (primary) hypertension: Secondary | ICD-10-CM | POA: Insufficient documentation

## 2022-04-13 DIAGNOSIS — S93402A Sprain of unspecified ligament of left ankle, initial encounter: Secondary | ICD-10-CM | POA: Insufficient documentation

## 2022-04-13 MED ORDER — IBUPROFEN 800 MG PO TABS: 800.0000 mg | ORAL_TABLET | Freq: Three times a day (TID) | ORAL | 0 refills | Status: AC | PRN

## 2022-04-13 MED ORDER — ONDANSETRON 4 MG PO TBDP: 4.0000 mg | ORAL_TABLET | Freq: Four times a day (QID) | ORAL | 0 refills | Status: AC | PRN

## 2022-04-13 MED ORDER — HYDROCHLOROTHIAZIDE 12.5 MG PO TABS
12.5000 mg | ORAL_TABLET | Freq: Once | ORAL | Status: AC
  Administered 2022-04-13: 12.5 mg via ORAL
  Filled 2022-04-13: qty 1

## 2022-04-13 MED ORDER — OXYCODONE-ACETAMINOPHEN 5-325 MG PO TABS
2.0000 | ORAL_TABLET | Freq: Once | ORAL | Status: AC
  Administered 2022-04-13: 2 via ORAL
  Filled 2022-04-13: qty 2

## 2022-04-13 MED ORDER — IBUPROFEN 800 MG PO TABS
800.0000 mg | ORAL_TABLET | Freq: Once | ORAL | Status: AC
  Administered 2022-04-13: 800 mg via ORAL
  Filled 2022-04-13: qty 1

## 2022-04-13 MED ORDER — LISINOPRIL-HYDROCHLOROTHIAZIDE 10-12.5 MG PO TABS: 1.0000 | ORAL_TABLET | Freq: Every day | ORAL | 2 refills | Status: AC

## 2022-04-13 MED ORDER — LISINOPRIL 10 MG PO TABS
10.0000 mg | ORAL_TABLET | Freq: Once | ORAL | Status: AC
  Administered 2022-04-13: 10 mg via ORAL
  Filled 2022-04-13: qty 1

## 2022-04-13 MED ORDER — OXYCODONE HCL 5 MG PO TABS: 5.0000 mg | ORAL_TABLET | ORAL | 0 refills | Status: AC | PRN

## 2022-04-13 NOTE — ED Notes (Signed)
Crutches have a weight limit of 250lb, after speaking with Dr. Leonides Schanz it was agreed that pt could still use them but to reinforce to him that they were not to bear all of his weight. This RN emphasized that point to the pt after explaining the weight limit and pt verbalized understanding that they were meant to assist in ambulation but not for full weight bearing.

## 2022-04-13 NOTE — ED Provider Notes (Signed)
[facility] Provider Note    Event Date/Time   First MD Initiated Contact with Patient 04/13/22 0405     (approximate)   History   No chief complaint on file.   HPI  Dean Tucker is a 26 y.o. male with history of morbid obesity, hypertension, hyperlipidemia, bipolar disorder who presents to the emergency department after he fell walking down some steps and twisted his left ankle 4 days ago.  Has been using a walker at home to get around as it hurts to bear weight.  Did not hit his head or lose consciousness.  No neck or back pain.  No chest or abdominal pain.   Patient also hypertensive here.  He is on lisinopril 10 mg and hydrochlorothiazide 12.5 mg.  States he has been off of them for about a month because he cannot get a refill.  He denies headache, vision changes, chest pain, shortness of breath, numbness, tingling or weakness.    History provided by patient.    Past Medical History:  Diagnosis Date   ADHD (attention deficit hyperactivity disorder)    Bipolar disorder ([place])    Depression    Hyperlipidemia    Hypertension    Obesity    Obesity     Past Surgical History:  Procedure Laterality Date   ADENOIDECTOMY  10/1996   WISDOM TOOTH EXTRACTION      MEDICATIONS:  Prior to Admission medications   Medication Sig Start Date End Date Taking? Authorizing Provider  colchicine 0.6 MG tablet Take 1 tablet (0.6 mg total) by mouth 2 (two) times daily. 09/10/21   Mecum, Erin E, PA-C  lisinopril-hydrochlorothiazide (ZESTORETIC) 10-12.5 MG tablet Take 1 tablet by mouth daily. 09/10/21 10/10/21  Mecum, Erin E, PA-C  predniSONE (DELTASONE) 10 MG tablet 50 mg daily x 3 days, then 40 mg daily x 3 days, then 30 mg daily x 3 days, then 20 mg daily x 3 days, then 10 mg daily x 3 days. Patient not taking: Reported on 09/10/2021 02/21/21   Coral Spikes, DO  hydrochlorothiazide (HYDRODIURIL) 25 MG tablet TAKE 1 TABLET BY MOUTH EVERY DAY 02/02/19 04/07/20   Chrismon, Vickki Muff, PA-C    Physical Exam   Triage Vital Signs: ED Triage Vitals  Enc Vitals Group     BP 04/13/22 0208 (!) 201/109     Pulse Rate 04/13/22 0208 (!) 106     Resp 04/13/22 0208 20     Temp 04/13/22 0208 98.1 F (36.7 C)     Temp Source 04/13/22 0208 Oral     SpO2 04/13/22 0208 96 %     Weight 04/13/22 0206 (!) 440 lb (199.6 kg)     Height 04/13/22 0206 6\' 1"  (1.854 m)     Head Circumference --      Peak Flow --      Pain Score 04/13/22 0206 10     Pain Loc --      Pain Edu? --      Excl. in [place]? --     Most recent vital signs: Vitals:   04/13/22 0422 04/13/22 0502  BP: (!) 184/103 (!) 155/87  Pulse: 100 100  Resp: 20   Temp:    SpO2: 99% 96%     CONSTITUTIONAL: Alert and oriented and responds appropriately to questions. Well-appearing; well-nourished; GCS 15, morbidly obese HEAD: Normocephalic; atraumatic EYES: Conjunctivae clear, PERRL, EOMI ENT: normal nose; no rhinorrhea; moist mucous membranes; pharynx without lesions noted; no dental injury;  no septal hematoma, no epistaxis; no facial deformity or bony tenderness NECK: Supple, no midline spinal tenderness, step-off or deformity; trachea midline CARD: RRR; S1 and S2 appreciated; no murmurs, no clicks, no rubs, no gallops RESP: Normal chest excursion without splinting or tachypnea; breath sounds clear and equal bilaterally; no wheezes, no rhonchi, no rales; no hypoxia or respiratory distress CHEST:  chest wall stable, no crepitus or ecchymosis or deformity, nontender to palpation; no flail chest ABD/GI: Normal bowel sounds; non-distended; soft, non-tender, no rebound, no guarding; no ecchymosis or other lesions noted PELVIS:  stable, nontender to palpation BACK:  The back appears normal; no midline spinal tenderness, step-off or deformity EXT: Patient has soft tissue swelling noted to the left foot and ankle and is quite tender to palpation diffusely but no bony deformity.  His extremities are warm and  well-perfused and his compartments are soft.  No tenderness over the left proximal fibular head. SKIN: Normal color for age and race; warm NEURO: No facial asymmetry, normal speech, moving all extremities equally  ED Results / Procedures / Treatments   LABS: (all labs ordered are listed, but only abnormal results are displayed) Labs Reviewed - No data to display   EKG:  RADIOLOGY: My personal review and interpretation of imaging: X-ray of the left foot and ankle show no fracture.  He does have soft tissue swelling.  I have personally reviewed all radiology reports. DG Foot Complete Left  Result Date: 04/13/2022 CLINICAL DATA:  Fall 2 days ago with left foot pain, initial encounter EXAM: LEFT FOOT - COMPLETE 3+ VIEW COMPARISON:  None Available. FINDINGS: Considerable soft tissue swelling is noted along the distal aspect of the metatarsals. No acute fracture or dislocation is seen. No foreign body is noted. Calcaneal spurring is seen. IMPRESSION: Soft tissue swelling in the distal aspect of the foot. No acute bony abnormality noted. Electronically Signed   By: Alcide Clever M.D.   On: 04/13/2022 02:53   DG Ankle Complete Left  Result Date: 04/13/2022 CLINICAL DATA:  Fall 2 days ago with persistent ankle pain, initial encounter EXAM: LEFT ANKLE COMPLETE - 3+ VIEW COMPARISON:  None Available. FINDINGS: Considerable soft tissue swelling is noted about the ankle joint. No acute fracture or dislocation is noted. Small calcaneal spurs are noted. IMPRESSION: Soft tissue swelling without acute bony abnormality. Electronically Signed   By: Alcide Clever M.D.   On: 04/13/2022 02:52     PROCEDURES:  Critical Care performed: No    Procedures    IMPRESSION / MDM / ASSESSMENT AND PLAN / ED COURSE  I reviewed the triage vital signs and the nursing notes.  Patient here after a fall 4 days ago with left foot and ankle pain.  Also here with asymptomatic hypertension.     DIFFERENTIAL DIAGNOSIS  (includes but not limited to):   Foot/ankle sprain, fracture, doubt dislocation, no signs of gout, septic arthritis, cellulitis, compartment syndrome, arterial obstruction, DVT.  Patient also has asymptomatic hypertension.  Doubt intracranial hemorrhage, ACS, dissection, CVA.  Patient's presentation is most consistent with acute complicated illness / injury requiring diagnostic workup.  PLAN: X-rays obtained of the left foot and ankle and reviewed and interpreted by myself and the radiologist and show no fracture or dislocation.  He is quite swollen over this area.  I have recommended compression, ice and elevation.  Will discharge with pain medication.  He is also extremely hypertensive here but asymptomatic.  Blood pressure did improve once receiving pain medication and I have  also restarted his lisinopril and HCTZ and provided him with a prescription.  I feel he is safe for discharge with further outpatient management with PCP and orthopedics.   MEDICATIONS GIVEN IN ED: Medications  oxyCODONE-acetaminophen (PERCOCET/ROXICET) 5-325 MG per tablet 2 tablet (2 tablets Oral Given 04/13/22 0425)  ibuprofen (ADVIL) tablet 800 mg (800 mg Oral Given 04/13/22 0425)  lisinopril (ZESTRIL) tablet 10 mg (10 mg Oral Given 04/13/22 0510)  hydrochlorothiazide (HYDRODIURIL) tablet 12.5 mg (12.5 mg Oral Given 04/13/22 0509)     ED COURSE:  At this time, I do not feel there is any life-threatening condition present. I reviewed all nursing notes, vitals, pertinent previous records.  All lab and urine results, EKGs, imaging ordered have been independently reviewed and interpreted by myself.  I reviewed all available radiology reports from any imaging ordered this visit.  Based on my assessment, I feel the patient is safe to be discharged home without further emergent workup and can continue workup as an outpatient as needed. Discussed all findings, treatment plan as well as usual and customary return precautions.  They  verbalize understanding and are comfortable with this plan.  Outpatient follow-up has been provided as needed.  All questions have been answered.    CONSULTS:  none   OUTSIDE RECORDS REVIEWED: Reviewed patient's last office visit with Dr. Burnadette Pop on 10/16/2021.       FINAL CLINICAL IMPRESSION(S) / ED DIAGNOSES   Final diagnoses:  Moderate left ankle sprain, initial encounter  Essential hypertension     Rx / DC Orders   ED Discharge Orders          Ordered    oxyCODONE (ROXICODONE) 5 MG immediate release tablet  Every 4 hours PRN        04/13/22 0423    ibuprofen (ADVIL) 800 MG tablet  Every 8 hours PRN        04/13/22 0423    ondansetron (ZOFRAN-ODT) 4 MG disintegrating tablet  Every 6 hours PRN        04/13/22 0423    lisinopril-hydrochlorothiazide (ZESTORETIC) 10-12.5 MG tablet  Daily        04/13/22 0442             Note:  This document was prepared using Dragon voice recognition software and may include unintentional dictation errors.   Neelah Mannings, Layla Maw, DO 04/13/22 [phone]

## 2022-04-13 NOTE — ED Notes (Signed)
Pt brought to ED rm 9 at this time, this RN now assuming care. 

## 2022-04-13 NOTE — ED Triage Notes (Signed)
Pt presents via POV with complaints left ankle pain  following a fall Sunday AM. Hx of gout in that same location. He notes stepping off a curb and felt a "pop". CMS intact. Denies CP or SOB.

## 2022-04-13 NOTE — ED Notes (Signed)
Signing pad did not work, pt verbalized understanding of DC instructions. 

## 2022-04-13 NOTE — ED Notes (Signed)
Pt declined labs at this time 

## 2022-04-13 NOTE — Discharge Instructions (Addendum)

## 2024-01-08 ENCOUNTER — Other Ambulatory Visit: Payer: Self-pay

## 2024-01-08 ENCOUNTER — Emergency Department
Admission: EM | Admit: 2024-01-08 | Discharge: 2024-01-08 | Disposition: A | Discharge status: Home / Self Care | Attending: Emergency Medicine | Admitting: Emergency Medicine

## 2024-01-08 DIAGNOSIS — K625 Hemorrhage of anus and rectum: Secondary | ICD-10-CM | POA: Insufficient documentation

## 2024-01-08 DIAGNOSIS — I10 Essential (primary) hypertension: Secondary | ICD-10-CM | POA: Diagnosis not present

## 2024-01-08 DIAGNOSIS — K5900 Constipation, unspecified: Secondary | ICD-10-CM | POA: Insufficient documentation

## 2024-01-08 HISTORY — DX: Gout, unspecified: M10.9

## 2024-01-08 LAB — COMPREHENSIVE METABOLIC PANEL WITH GFR
ALT: 45 U/L — ABNORMAL HIGH (ref 0–44)
AST: 28 U/L (ref 15–41)
Albumin: 4.7 g/dL (ref 3.5–5.0)
Alkaline Phosphatase: 59 U/L (ref 38–126)
Anion gap: 9 (ref 5–15)
BUN: 16 mg/dL (ref 6–20)
CO2: 23 mmol/L (ref 22–32)
Calcium: 9.7 mg/dL (ref 8.9–10.3)
Chloride: 105 mmol/L (ref 98–111)
Creatinine, Ser: 0.86 mg/dL (ref 0.61–1.24)
GFR, Estimated: >60 mL/min (ref >60)
Glucose, Bld: 87 mg/dL (ref 70–99)
Potassium: 4.4 mmol/L (ref 3.5–5.1)
Sodium: 137 mmol/L (ref 135–145)
Total Bilirubin: 1 mg/dL (ref 0.0–1.2)
Total Protein: 8 g/dL (ref 6.5–8.1)

## 2024-01-08 LAB — TYPE AND SCREEN
ABO/RH(D): O POS
Antibody Screen: NEGATIVE

## 2024-01-08 LAB — CBC
HCT: 46.6 % (ref 39.0–52.0)
Hemoglobin: 15.3 g/dL (ref 13.0–17.0)
MCH: 28.8 pg (ref 26.0–34.0)
MCHC: 32.8 g/dL (ref 30.0–36.0)
MCV: 87.6 fL (ref 80.0–100.0)
Platelets: 293 10*3/uL (ref 150–400)
RBC: 5.32 MIL/uL (ref 4.22–5.81)
RDW: 12.6 % (ref 11.5–15.5)
WBC: 9.8 10*3/uL (ref 4.0–10.5)
nRBC: 0 % (ref 0.0–0.2)

## 2024-01-08 MED ORDER — POLYETHYLENE GLYCOL 3350 17 G PO PACK: 17.0000 g | PACK | Freq: Every day | ORAL | 0 refills | Status: AC

## 2024-01-08 MED ORDER — BENEFIBER DRINK MIX PO PACK: 1.0000 | PACK | Freq: Every day | ORAL | 0 refills | Status: AC

## 2024-01-08 NOTE — Discharge Instructions (Signed)
 Please take the MiraLAX and Benefiber daily for constipation.  Please ensure to follow-up with a gastroenterologist for further management of your symptoms.

## 2024-01-08 NOTE — ED Provider Notes (Signed)
 [facility] Provider Note    Event Date/Time   First MD Initiated Contact with Patient 01/08/24 1435     (approximate)   History   Rectal Bleeding   HPI  Dean Tucker is a 28 y.o. male with history of hypertension, presenting with blood in his stool.  Patient states that this been happening intermittently for last 3 days.  Also does have some constipation and straining.  He denies any abdominal pain or nausea or vomiting.  States that this has happened in the past but he has not followed up with GI.  He denies any prior history of IBS, IBD, colon cancer.  Denies any family history of this.  States that he took his blood pressure medications right before coming to the emergency department and so thinks that he has not kicked in yet.  He denies any chest pain or shortness of breath, no vision changes or weakness or numbness or slurred speech.  On independent review, he is seen by primary care doctor in June, his blood pressures were noted during those exams to be elevated.     Physical Exam   Triage Vital Signs: ED Triage Vitals  Encounter Vitals Group     BP 01/08/24 1409 (!) 205/139     Girls Systolic BP Percentile --      Girls Diastolic BP Percentile --      Boys Systolic BP Percentile --      Boys Diastolic BP Percentile --      Pulse Rate 01/08/24 1409 87     Resp 01/08/24 1409 18     Temp 01/08/24 1409 98.8 F (37.1 C)     Temp Source 01/08/24 1409 Oral     SpO2 01/08/24 1409 98 %     Weight 01/08/24 1411 (!) 414 lb (187.8 kg)     Height 01/08/24 1411 6' 2 (1.88 m)     Head Circumference --      Peak Flow --      Pain Score 01/08/24 1409 0     Pain Loc --      Pain Education --      Exclude from Growth Chart --     Most recent vital signs: Vitals:   01/08/24 1409 01/08/24 1431  BP: (!) 205/139 (!) 199/123  Pulse: 87   Resp: 18   Temp: 98.8 F (37.1 C)   SpO2: 98%      General: Awake, no distress.  CV:  Good peripheral  perfusion.  Resp:  Normal effort.  Abd:  No distention.  Nontender Other:  Rectal exam was done with chaperone, no obvious external hemorrhoids, brown stool in rectal vault   ED Results / Procedures / Treatments   Labs (all labs ordered are listed, but only abnormal results are displayed) Labs Reviewed  COMPREHENSIVE METABOLIC PANEL WITH GFR - Abnormal; Notable for the following components:      Result Value   ALT 45 (*)    All other components within normal limits  CBC  POC OCCULT BLOOD, ED  TYPE AND SCREEN      PROCEDURES:  Critical Care performed: No  Procedures   MEDICATIONS ORDERED IN ED: Medications - No data to display   IMPRESSION / MDM / ASSESSMENT AND PLAN / ED COURSE  I reviewed the triage vital signs and the nursing notes.  Differential diagnosis includes, but is not limited to, constipation, hemorrhoids, IBD, anemia.  Labs were obtained on triage.  Patient's presentation is most consistent with acute presentation with potential threat to life or bodily function.  Independent interpretation of labs below.  No acute anemia, he is not actively bleeding right now.  Considered but no indication for additional workup or inpatient admission at this time, he safe for outpatient management.  With regards to her hypertension, suspect this is asymptomatic hypertension given that he has no symptoms at this time.  He is safe for outpatient management.  Discharge instructions follow-up with gastroenterology, number was given to him in discharge paperwork.  Also give him MiraLAX and Benefiber that he can take for the constipation.  Shared decision making to patient and he is agreeable with this plan.  Discharge with strict return precautions.    Clinical Course as of 01/08/24 1503  Sun Jan 08, 2024  1438 Independent review of labs, no leukocytosis, H&H stable, electrolytes not severely deranged, ALT is mildly elevated. [TT]    Clinical  Course User Index [TT] Drenda Gentle Richard Champion, MD     FINAL CLINICAL IMPRESSION(S) / ED DIAGNOSES   Final diagnoses:  Rectal bleeding  Hypertension, unspecified type  Constipation, unspecified constipation type     Rx / DC Orders   ED Discharge Orders          Ordered    polyethylene glycol (MIRALAX) 17 g packet  Daily        01/08/24 1458    Wheat Dextrin (BENEFIBER DRINK MIX) PACK  Daily        01/08/24 1458             Note:  This document was prepared using Dragon voice recognition software and may include unintentional dictation errors.    Shane Darling, MD 01/08/24 [phone]

## 2024-01-08 NOTE — ED Triage Notes (Signed)
 Patient states bright red blood in stool since Friday; denies abdominal pain.

## 2024-01-08 NOTE — ED Notes (Signed)
 See triage note  Presents with some bright red blood in his stools  States noticed this on Friday  Denies any n/v  Afebrile on arrival
